# Patient Record
Sex: Female | Born: 1960 | Race: White | Hispanic: No | Smoking: Never smoker
Health system: Southern US, Community
[De-identification: ages and names within clinical notes are randomized; demographics above are authoritative.]

## PROBLEM LIST (undated history)

## (undated) DIAGNOSIS — E785 Hyperlipidemia, unspecified: Secondary | ICD-10-CM

## (undated) DIAGNOSIS — R972 Elevated prostate specific antigen [PSA]: Secondary | ICD-10-CM

## (undated) DIAGNOSIS — N400 Enlarged prostate without lower urinary tract symptoms: Secondary | ICD-10-CM

## (undated) DIAGNOSIS — T7840XA Allergy, unspecified, initial encounter: Secondary | ICD-10-CM

## (undated) DIAGNOSIS — R55 Syncope and collapse: Secondary | ICD-10-CM

## (undated) HISTORY — DX: Hyperlipidemia, unspecified: E78.5

## (undated) HISTORY — DX: Elevated prostate specific antigen (PSA): R97.20

## (undated) HISTORY — DX: Allergy, unspecified, initial encounter: T78.40XA

---

## 1983-02-12 DIAGNOSIS — R55 Syncope and collapse: Secondary | ICD-10-CM | POA: Insufficient documentation

## 2003-02-12 DIAGNOSIS — Z8679 Personal history of other diseases of the circulatory system: Secondary | ICD-10-CM | POA: Insufficient documentation

## 2005-02-11 DIAGNOSIS — Z86018 Personal history of other benign neoplasm: Secondary | ICD-10-CM

## 2005-02-11 HISTORY — DX: Personal history of other benign neoplasm: Z86.018

## 2012-02-12 DIAGNOSIS — R972 Elevated prostate specific antigen [PSA]: Secondary | ICD-10-CM

## 2012-02-12 HISTORY — DX: Elevated prostate specific antigen (PSA): R97.20

## 2012-04-09 ENCOUNTER — Encounter: Payer: Self-pay | Admitting: Gastroenterology

## 2012-04-09 ENCOUNTER — Ambulatory Visit: Payer: Self-pay | Admitting: Gastroenterology

## 2012-05-12 HISTORY — PX: COLONOSCOPY: SHX174

## 2012-06-26 DIAGNOSIS — R972 Elevated prostate specific antigen [PSA]: Secondary | ICD-10-CM | POA: Insufficient documentation

## 2012-08-02 ENCOUNTER — Emergency Department (HOSPITAL_COMMUNITY): Payer: BC Managed Care – PPO

## 2012-08-02 ENCOUNTER — Emergency Department (HOSPITAL_COMMUNITY)
Admission: EM | Admit: 2012-08-02 | Discharge: 2012-08-02 | Disposition: A | Discharge status: Home / Self Care | Payer: BC Managed Care – PPO | Attending: Emergency Medicine | Admitting: Emergency Medicine

## 2012-08-02 ENCOUNTER — Encounter (HOSPITAL_COMMUNITY): Payer: Self-pay | Admitting: Emergency Medicine

## 2012-08-02 DIAGNOSIS — Y998 Other external cause status: Secondary | ICD-10-CM | POA: Insufficient documentation

## 2012-08-02 DIAGNOSIS — Z79899 Other long term (current) drug therapy: Secondary | ICD-10-CM | POA: Insufficient documentation

## 2012-08-02 DIAGNOSIS — N400 Enlarged prostate without lower urinary tract symptoms: Secondary | ICD-10-CM | POA: Insufficient documentation

## 2012-08-02 DIAGNOSIS — S43101A Unspecified dislocation of right acromioclavicular joint, initial encounter: Secondary | ICD-10-CM

## 2012-08-02 DIAGNOSIS — S43109A Unspecified dislocation of unspecified acromioclavicular joint, initial encounter: Secondary | ICD-10-CM | POA: Insufficient documentation

## 2012-08-02 HISTORY — DX: Benign prostatic hyperplasia without lower urinary tract symptoms: N40.0

## 2012-08-02 HISTORY — DX: Syncope and collapse: R55

## 2012-08-02 MED ORDER — OXYCODONE-ACETAMINOPHEN 5-325 MG PO TABS: ORAL_TABLET | ORAL | Status: DC
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
## 2012-08-04 NOTE — ED Provider Notes (Signed)
Medical screening examination/treatment/procedure(s) were performed by non-physician practitioner and as supervising physician I was immediately available for consultation/collaboration.   Shelda Jakes, MD 08/04/12 [phone]

## 2013-06-21 DIAGNOSIS — M545 Low back pain, unspecified: Secondary | ICD-10-CM | POA: Insufficient documentation

## 2015-05-16 DIAGNOSIS — D239 Other benign neoplasm of skin, unspecified: Secondary | ICD-10-CM

## 2015-05-16 HISTORY — DX: Other benign neoplasm of skin, unspecified: D23.9

## 2016-03-19 ENCOUNTER — Encounter (HOSPITAL_COMMUNITY): Payer: Self-pay | Admitting: Emergency Medicine

## 2016-03-19 ENCOUNTER — Ambulatory Visit (HOSPITAL_COMMUNITY)
Admission: EM | Admit: 2016-03-19 | Discharge: 2016-03-19 | Disposition: A | Discharge status: Home / Self Care | Payer: BLUE CROSS/BLUE SHIELD | Attending: Family Medicine | Admitting: Family Medicine

## 2016-03-19 DIAGNOSIS — J40 Bronchitis, not specified as acute or chronic: Secondary | ICD-10-CM

## 2016-03-19 MED ORDER — HYDROCODONE-HOMATROPINE 5-1.5 MG/5ML PO SYRP: 5.0000 mL | ORAL_SOLUTION | Freq: Four times a day (QID) | ORAL | 0 refills | Status: DC | PRN

## 2016-03-19 MED ORDER — ALBUTEROL SULFATE HFA 108 (90 BASE) MCG/ACT IN AERS: 2.0000 | INHALATION_SPRAY | RESPIRATORY_TRACT | 1 refills | Status: DC | PRN

## 2016-03-19 NOTE — ED Triage Notes (Signed)
The patient presented to the [facility] with a complaint of a cough, congestion and body aches x 2 weeks. The patient reported already being evaluated and treated with an antibiotic.

## 2016-03-19 NOTE — ED Provider Notes (Signed)
[place]    CSN: [id number] Arrival date & time: 03/19/16  1701     History   Chief Complaint Chief Complaint  Patient presents with  . Cough    HPI Stacey Oconnell is a 56 y.o. female.   Stacey Oconnell is a 56 year old man who has a cough for 2 weeks. He's only taken a round of antibiotics without improvement. Some days are better than others and the wheezing has resolved but he still has a violent cough.  Patient is working on Radio producer.      Past Medical History:  Diagnosis Date  . Enlarged prostate   . Syncope     There are no active problems to display for this patient.   History reviewed. No pertinent surgical history.     Home Medications    Prior to Admission medications   Medication Sig Start Date End Date Taking? Authorizing Provider  azithromycin (ZITHROMAX) 1 g powder Take 1 g by mouth once.   Yes Historical Provider, MD  methylPREDNISolone acetate (DEPO-MEDROL) 40 MG/ML injection (RADIOLOGY ONLY) 40 mg once.   Yes Historical Provider, MD  albuterol (PROVENTIL HFA;VENTOLIN HFA) 108 (90 Base) MCG/ACT inhaler Inhale 2 puffs into the lungs every 4 (four) hours as needed for wheezing or shortness of breath (cough, shortness of breath or wheezing.). 03/19/16   Robyn Haber, MD  HYDROcodone-homatropine (HYDROMET) 5-1.5 MG/5ML syrup Take 5 mLs by mouth every 6 (six) hours as needed for cough. 03/19/16   Robyn Haber, MD    Family History History reviewed. No pertinent family history.  Social History Social History  Substance Use Topics  . Smoking status: Never Smoker  . Smokeless tobacco: Not on file  . Alcohol use Yes     Comment: Social     Allergies   Patient has no known allergies.   Review of Systems Review of Systems  Constitutional: Positive for activity change. Negative for fever.  HENT: Negative.   Respiratory: Positive for cough. Negative for wheezing.   Cardiovascular: Negative.   Gastrointestinal: Negative.     Musculoskeletal: Negative.   Neurological: Negative.      Physical Exam Triage Vital Signs ED Triage Vitals  Enc Vitals Group     BP 03/19/16 1827 106/74     Pulse Rate 03/19/16 1827 102     Resp 03/19/16 1827 16     Temp 03/19/16 1827 98.7 F (37.1 C)     Temp Source 03/19/16 1827 Oral     SpO2 03/19/16 1827 100 %     Weight --      Height --      Head Circumference --      Peak Flow --      Pain Score 03/19/16 1826 0     Pain Loc --      Pain Edu? --      Excl. in [place]? --    No data found.   Updated Vital Signs BP 106/74 (BP Location: Right Arm)   Pulse 102   Temp 98.7 F (37.1 C) (Oral)   Resp 16   SpO2 100%    Physical Exam  Constitutional: He is oriented to person, place, and time. He appears well-developed and well-nourished.  HENT:  Right Ear: External ear normal.  Left Ear: External ear normal.  Mouth/Throat: Oropharynx is clear and moist.  Eyes: Conjunctivae are normal. Pupils are equal, round, and reactive to light.  Neck: Normal range of motion. Neck supple.  Cardiovascular: Normal rate and  regular rhythm.   Pulmonary/Chest: Effort normal. He has wheezes. He has rales.  Musculoskeletal: Normal range of motion.  Neurological: He is alert and oriented to person, place, and time.  Skin: Skin is warm and dry.  Nursing note and vitals reviewed.    UC Treatments / Results  Labs (all labs ordered are listed, but only abnormal results are displayed) Labs Reviewed - No data to display  EKG  EKG Interpretation None       Radiology No results found.  Procedures Procedures (including critical care time)  Medications Ordered in UC Medications - No data to display   Initial Impression / Assessment and Plan / UC Course  I have reviewed the triage vital signs and the nursing notes.  Pertinent labs & imaging results that were available during my care of the patient were reviewed by me and considered in my medical decision making (see chart for  details).     Final Clinical Impressions(s) / UC Diagnoses   Final diagnoses:  Bronchitis    New Prescriptions New Prescriptions   ALBUTEROL (PROVENTIL HFA;VENTOLIN HFA) 108 (90 BASE) MCG/ACT INHALER    Inhale 2 puffs into the lungs every 4 (four) hours as needed for wheezing or shortness of breath (cough, shortness of breath or wheezing.).   HYDROCODONE-HOMATROPINE (HYDROMET) 5-1.5 MG/5ML SYRUP    Take 5 mLs by mouth every 6 (six) hours as needed for cough.     Robyn Haber, MD 03/19/16 [phone]

## 2017-07-21 ENCOUNTER — Ambulatory Visit: Payer: BLUE CROSS/BLUE SHIELD | Admitting: Family Medicine

## 2017-07-21 ENCOUNTER — Encounter: Payer: Self-pay | Admitting: Family Medicine

## 2017-07-21 ENCOUNTER — Other Ambulatory Visit: Payer: Self-pay

## 2017-07-21 VITALS — BP 121/78 | HR 90 | Temp 97.8°F | Resp 16 | Ht 73.0 in | Wt 169.8 lb

## 2017-07-21 DIAGNOSIS — R972 Elevated prostate specific antigen [PSA]: Secondary | ICD-10-CM

## 2017-07-21 DIAGNOSIS — Z79899 Other long term (current) drug therapy: Secondary | ICD-10-CM | POA: Diagnosis not present

## 2017-07-21 DIAGNOSIS — Z8679 Personal history of other diseases of the circulatory system: Secondary | ICD-10-CM

## 2017-07-21 DIAGNOSIS — E782 Mixed hyperlipidemia: Secondary | ICD-10-CM | POA: Diagnosis not present

## 2017-07-21 DIAGNOSIS — E559 Vitamin D deficiency, unspecified: Secondary | ICD-10-CM

## 2017-07-21 DIAGNOSIS — Z7952 Long term (current) use of systemic steroids: Secondary | ICD-10-CM

## 2017-07-21 DIAGNOSIS — Z789 Other specified health status: Secondary | ICD-10-CM

## 2017-07-21 DIAGNOSIS — F640 Transsexualism: Secondary | ICD-10-CM

## 2017-07-21 LAB — POCT URINALYSIS DIP (MANUAL ENTRY)
BILIRUBIN UA: NEGATIVE mg/dL
Bilirubin, UA: NEGATIVE
Blood, UA: NEGATIVE
Glucose, UA: NEGATIVE mg/dL
LEUKOCYTES UA: NEGATIVE
Nitrite, UA: NEGATIVE
PH UA: 6 (ref 5.0–8.0)
PROTEIN UA: NEGATIVE mg/dL
Spec Grav, UA: 1.02 (ref 1.010–1.025)
Urobilinogen, UA: 0.2 E.U./dL

## 2017-07-21 MED ORDER — SPIRONOLACTONE 100 MG PO TABS: 100.0000 mg | ORAL_TABLET | Freq: Two times a day (BID) | ORAL | 1 refills | Status: DC

## 2017-07-21 MED ORDER — ESTRADIOL 2 MG PO TABS: 2.0000 mg | ORAL_TABLET | Freq: Three times a day (TID) | ORAL | 1 refills | Status: DC

## 2017-07-21 NOTE — Patient Instructions (Addendum)
Start 2000u/d over-the-counter vitamin D supplement. Consider supplementing with red yeast rice or coenzyme Q10 which are different products thought to help bring cholesterol down. We are not sure if otc fish oil supplements actually do anything other than make the numbers look prettier anymore but it doesn't hurt to try to add this in to make your numbers look prettier if you would like. I will calculate your cardiac risk score and send you a MyChart email as FYI in my down time.  Delightful meeting you today!  Thanks for transferring your care here and I will really look forward to getting to know you better over the years.    IF you received an x-ray today, you will receive an invoice from [facility]. Please contact [facility] at [phone] with questions or concerns regarding your invoice.   IF you received labwork today, you will receive an invoice from [place]. Please contact [facility] at [phone] with questions or concerns regarding your invoice.   Our billing staff will not be able to assist you with questions regarding bills from these companies.  You will be contacted with the lab results as soon as they are available. The fastest way to get your results is to activate your My Chart account. Instructions are located on the last page of this paperwork. If you have not heard from [place] regarding the results in 2 weeks, please contact this office.      Mediterranean Diet A Mediterranean diet refers to food and lifestyle choices that are based on the traditions of countries located on the [employer]. This way of eating has been shown to help prevent certain conditions and improve outcomes for people who have chronic diseases, like kidney disease and heart disease. What are tips for following this plan? Lifestyle  Cook and eat meals together with your family, when possible.  Drink enough fluid to keep your urine clear or pale yellow.  Be physically  active every day. This includes: ? Aerobic exercise like running or swimming. ? Leisure activities like gardening, walking, or housework.  Get 7-8 hours of sleep each night.  If recommended by your health care provider, drink red wine in moderation. This means 1 glass a day for nonpregnant women and 2 glasses a day for men. A glass of wine equals 5 oz (150 mL). Reading food labels  Check the serving size of packaged foods. For foods such as rice and pasta, the serving size refers to the amount of cooked product, not dry.  Check the total fat in packaged foods. Avoid foods that have saturated fat or trans fats.  Check the ingredients list for added sugars, such as corn syrup. Shopping  At the grocery store, buy most of your food from the areas near the walls of the store. This includes: ? Fresh fruits and vegetables (produce). ? Grains, beans, nuts, and seeds. Some of these may be available in unpackaged forms or large amounts (in bulk). ? Fresh seafood. ? Poultry and eggs. ? Low-fat dairy products.  Buy whole ingredients instead of prepackaged foods.  Buy fresh fruits and vegetables in-season from local farmers markets.  Buy frozen fruits and vegetables in resealable bags.  If you do not have access to quality fresh seafood, buy precooked frozen shrimp or canned fish, such as tuna, salmon, or sardines.  Buy small amounts of raw or cooked vegetables, salads, or olives from the deli or salad bar at your store.  Stock your pantry so you always have certain foods  on hand, such as olive oil, canned tuna, canned tomatoes, rice, pasta, and beans. Cooking  Cook foods with extra-virgin olive oil instead of using butter or other vegetable oils.  Have meat as a side dish, and have vegetables or grains as your main dish. This means having meat in small portions or adding small amounts of meat to foods like pasta or stew.  Use beans or vegetables instead of meat in common dishes like chili  or lasagna.  Experiment with different cooking methods. Try roasting or broiling vegetables instead of steaming or sauteing them.  Add frozen vegetables to soups, stews, pasta, or rice.  Add nuts or seeds for added healthy fat at each meal. You can add these to yogurt, salads, or vegetable dishes.  Marinate fish or vegetables using olive oil, lemon juice, garlic, and fresh herbs. Meal planning  Plan to eat 1 vegetarian meal one day each week. Try to work up to 2 vegetarian meals, if possible.  Eat seafood 2 or more times a week.  Have healthy snacks readily available, such as: ? Vegetable sticks with hummus. ? [place] yogurt. ? Fruit and nut trail mix.  Eat balanced meals throughout the week. This includes: ? Fruit: 2-3 servings a day ? Vegetables: 4-5 servings a day ? Low-fat dairy: 2 servings a day ? Fish, poultry, or lean meat: 1 serving a day ? Beans and legumes: 2 or more servings a week ? Nuts and seeds: 1-2 servings a day ? Whole grains: 6-8 servings a day ? Extra-virgin olive oil: 3-4 servings a day  Limit red meat and sweets to only a few servings a month What are my food choices?  Mediterranean diet ? Recommended ? Grains: Whole-grain pasta. Brown rice. Bulgar wheat. Polenta. Couscous. Whole-wheat bread. [place]. ? Vegetables: Artichokes. Beets. Broccoli. Cabbage. Carrots. Eggplant. Green beans. Chard. Kale. Spinach. Onions. Leeks. Peas. Squash. Tomatoes. Peppers. Radishes. ? Fruits: Apples. Apricots. Avocado. Berries. Bananas. Cherries. Dates. Figs. Grapes. Lemons. Melon. Oranges. Peaches. Plums. Pomegranate. ? Meats and other protein foods: Beans. Almonds. Sunflower seeds. Pine nuts. Peanuts. [place]. Salmon. Scallops. Shrimp. [place]. Tilapia. Clams. Oysters. Eggs. ? Dairy: Low-fat milk. Cheese. Greek yogurt. ? Beverages: Water. Red wine. Herbal tea. ? Fats and oils: Extra virgin olive oil. Avocado oil. Grape seed oil. ? Sweets and desserts: [place] yogurt with  honey. Baked apples. Poached pears. Trail mix. ? Seasoning and other foods: Basil. Cilantro. Coriander. Cumin. Mint. Parsley. Sage. Rosemary. Tarragon. Garlic. Oregano. Thyme. Pepper. Balsalmic vinegar. Tahini. Hummus. Tomato sauce. Olives. Mushrooms. ? Limit these ? Grains: Prepackaged pasta or rice dishes. Prepackaged cereal with added sugar. ? Vegetables: Deep fried potatoes (french fries). ? Fruits: Fruit canned in syrup. ? Meats and other protein foods: Beef. Pork. Lamb. Poultry with skin. Hot dogs. Berniece Salines. ? Dairy: Ice cream. Sour cream. Whole milk. ? Beverages: Juice. Sugar-sweetened soft drinks. Beer. Liquor and spirits. ? Fats and oils: Butter. Canola oil. Vegetable oil. Beef fat (tallow). Lard. ? Sweets and desserts: Cookies. Cakes. Pies. Candy. ? Seasoning and other foods: Mayonnaise. Premade sauces and marinades. ? The items listed may not be a complete list. Talk with your dietitian about what dietary choices are right for you. Summary  The Mediterranean diet includes both food and lifestyle choices.  Eat a variety of fresh fruits and vegetables, beans, nuts, seeds, and whole grains.  Limit the amount of red meat and sweets that you eat.  Talk with your health care provider about whether it is safe for you to  drink red wine in moderation. This means 1 glass a day for nonpregnant women and 2 glasses a day for men. A glass of wine equals 5 oz (150 mL). This information is not intended to replace advice given to you by your health care provider. Make sure you discuss any questions you have with your health care provider. Document Released: 09/21/2015 Document Revised: 10/24/2015 Document Reviewed: 09/21/2015 Elsevier Interactive Patient Education  [employer].    Why follow it? Research shows. . Those who follow the Mediterranean diet have a reduced risk of heart disease  . The diet is associated with a reduced incidence of Parkinson's and Alzheimer's diseases . People  following the diet may have longer life expectancies and lower rates of chronic diseases  . The Dietary Guidelines for Americans recommends the Mediterranean diet as an eating plan to promote health and prevent disease  What Is the Mediterranean Diet?  . Healthy eating plan based on typical foods and recipes of Mediterranean-style cooking . The diet is primarily a plant based diet; these foods should make up a majority of meals   Starches - Plant based foods should make up a majority of meals - They are an important sources of vitamins, minerals, energy, antioxidants, and fiber - Choose whole grains, foods high in fiber and minimally processed items  - Typical grain sources include wheat, oats, barley, corn, brown rice, bulgar, farro, millet, polenta, couscous  - Various types of beans include chickpeas, lentils, fava beans, black beans, white beans   Fruits  Veggies - Large quantities of antioxidant rich fruits & veggies; 6 or more servings  - Vegetables can be eaten raw or lightly drizzled with oil and cooked  - Vegetables common to the traditional Mediterranean Diet include: artichokes, arugula, beets, broccoli, brussel sprouts, cabbage, carrots, celery, collard greens, cucumbers, eggplant, kale, leeks, lemons, lettuce, mushrooms, okra, onions, peas, peppers, potatoes, pumpkin, radishes, rutabaga, shallots, spinach, sweet potatoes, turnips, zucchini - Fruits common to the Mediterranean Diet include: apples, apricots, avocados, cherries, clementines, dates, figs, grapefruits, grapes, melons, nectarines, oranges, peaches, pears, pomegranates, strawberries, tangerines  Fats - Replace butter and margarine with healthy oils, such as olive oil, canola oil, and tahini  - Limit nuts to no more than a handful a day  - Nuts include walnuts, almonds, pecans, pistachios, pine nuts  - Limit or avoid candied, honey roasted or heavily salted nuts - Olives are central to the [employer] - can be eaten  whole or used in a variety of dishes   Meats Protein - Limiting red meat: no more than a few times a month - When eating red meat: choose lean cuts and keep the portion to the size of deck of cards - Eggs: approx. 0 to 4 times a week  - Fish and lean poultry: at least 2 a week  - Healthy protein sources include, chicken, [place], lean beef, lamb - Increase intake of seafood such as tuna, salmon, trout, mackerel, shrimp, scallops - Avoid or limit high fat processed meats such as sausage and bacon  Dairy - Include moderate amounts of low fat dairy products  - Focus on healthy dairy such as fat free yogurt, skim milk, low or reduced fat cheese - Limit dairy products higher in fat such as whole or 2% milk, cheese, ice cream  Alcohol - Moderate amounts of red wine is ok  - No more than 5 oz daily for women (all ages) and men older than age 62  - No  more than 10 oz of wine daily for men younger than 48  Other - Limit sweets and other desserts  - Use herbs and spices instead of salt to flavor foods  - Herbs and spices common to the traditional Mediterranean Diet include: basil, bay leaves, chives, cloves, cumin, fennel, garlic, lavender, marjoram, mint, oregano, parsley, pepper, rosemary, sage, savory, sumac, tarragon, thyme   It's not just a diet, it's a lifestyle:  . The Mediterranean diet includes lifestyle factors typical of those in the region  . Foods, drinks and meals are best eaten with others and savored . Daily physical activity is important for overall good health . This could be strenuous exercise like running and aerobics . This could also be more leisurely activities such as walking, housework, yard-work, or taking the stairs . Moderation is the key; a balanced and healthy diet accommodates most foods and drinks . Consider portion sizes and frequency of consumption of certain foods   Meal Ideas & Options:  . Breakfast:  o Whole wheat toast or whole wheat English muffins with peanut  butter & hard boiled egg o Steel cut oats topped with apples & cinnamon and skim milk  o Fresh fruit: banana, strawberries, melon, berries, peaches  o Smoothies: strawberries, bananas, greek yogurt, peanut butter o Low fat greek yogurt with blueberries and granola  o Egg white omelet with spinach and mushrooms o Breakfast couscous: whole wheat couscous, apricots, skim milk, cranberries  . Sandwiches:  o Hummus and grilled vegetables (peppers, zucchini, squash) on whole wheat bread   o Grilled chicken on whole wheat pita with lettuce, tomatoes, cucumbers or tzatziki  o Tuna salad on whole wheat bread: tuna salad made with greek yogurt, olives, red peppers, capers, green onions o Garlic rosemary lamb pita: lamb sauted with garlic, rosemary, salt & pepper; add lettuce, cucumber, greek yogurt to pita - flavor with lemon juice and black pepper  . Seafood:  o Mediterranean grilled salmon, seasoned with garlic, basil, parsley, lemon juice and black pepper o Shrimp, lemon, and spinach whole-grain pasta salad made with low fat greek yogurt  o Seared scallops with lemon orzo  o Seared tuna steaks seasoned salt, pepper, coriander topped with tomato mixture of olives, tomatoes, olive oil, minced garlic, parsley, green onions and cappers  . Meats:  o Herbed greek chicken salad with kalamata olives, cucumber, feta  o Red bell peppers stuffed with spinach, bulgur, lean ground beef (or lentils) & topped with feta   o Kebabs: skewers of chicken, tomatoes, onions, zucchini, squash  o [place] burgers: made with red onions, mint, dill, lemon juice, feta cheese topped with roasted red peppers . Vegetarian o Cucumber salad: cucumbers, artichoke hearts, celery, red onion, feta cheese, tossed in olive oil & lemon juice  o Hummus and whole grain pita points with a greek salad (lettuce, tomato, feta, olives, cucumbers, red onion) o Lentil soup with celery, carrots made with vegetable broth, garlic, salt and pepper   o Tabouli salad: parsley, bulgur, mint, scallions, cucumbers, tomato, radishes, lemon juice, olive oil, salt and pepper.

## 2017-07-21 NOTE — Progress Notes (Addendum)
Subjective:  By signing my name below, I, Stacey Oconnell, attest that this documentation has been prepared under the direction and in the presence of Stacey Cheadle, MD Electronically Signed: Ladene Artist, ED Scribe 07/21/2017 at 10:34 AM.   Patient ID: Stacey Oconnell, adult    DOB: 1960-08-21, 57 y.o.   MRN: [id number]  Chief Complaint  Patient presents with  . Establish Care   HPI  Stacey Oconnell is a 56 y.o. adult who presents to Primary Care at [facility] to establish care. Prev followed by Dr. Vella Raring for 2-3 yrs. Taking estradiol 1 tab tid and spironolactone 100 mg bid. Pt states that she initially started the transition hoping she would hold onto family/friends; states she never lost anyone but actually gained a lot of friends. She was able to maintain her job, has not experienced any negatives and would not change a thing.  Plans to have surgery within the next few yrs; has had previous psychological clearance for medical hormone therapy and surgery by Stacey Oconnell at [facility].  Pt has had normal/neg HIV and Hep C screening.  H/o BPH with Elevated PSA Pt does report 1 elevated PSA in 2014. Oncologist at [facility] suspected irritation from riding her bike a lot at that time. Reports normal PSA readings since.  Allergies Pt reports flare up of allergies in May. Took Zyrtec x 2 wks, never needed to take Flonase. Has taken allergy shots as a child.  H/o High Cholesterol Pt does not eat any fast foods. Eats very little meat, generally has a lot of salads, fish and nuts, occasional vegetarian pasta, cooks with EVOO, rarely eats cakes or cookies. Father has a h/o high cholesterol. Started playing volleyball 2 wks ago, yoga 2 months ago and walking more.  Past Medical History:  Diagnosis Date  . Enlarged prostate   . Syncope    Current Outpatient Medications on File Prior to Visit  Medication Sig Dispense Refill  . estradiol (ESTRACE) 2 MG tablet Take 2 mg by mouth 2  (two) times daily.    Marland Kitchen spironolactone (ALDACTONE) 100 MG tablet Take 100 mg by mouth 2 (two) times daily.     No current facility-administered medications on file prior to visit.    No Known Allergies   History reviewed. No pertinent surgical history. History reviewed. No pertinent family history. Social History   Socioeconomic History  . Marital status: Divorced    Spouse name: Not on file  . Number of children: Not on file  . Years of education: Not on file  . Highest education level: Not on file  Occupational History  . Not on file  Social Needs  . Financial resource strain: Not on file  . Food insecurity:    Worry: Not on file    Inability: Not on file  . Transportation needs:    Medical: Not on file    Non-medical: Not on file  Tobacco Use  . Smoking status: Never Smoker  . Smokeless tobacco: Never Used  Substance and Sexual Activity  . Alcohol use: Yes    Comment: Social  . Drug use: No  . Sexual activity: Not on file  Lifestyle  . Physical activity:    Days per week: Not on file    Minutes per session: Not on file  . Stress: Not on file  Relationships  . Social connections:    Talks on phone: Not on file    Gets together: Not on file  Attends religious service: Not on file    Active member of club or organization: Not on file    Attends meetings of clubs or organizations: Not on file    Relationship status: Not on file  Other Topics Concern  . Not on file  Social History Narrative  . Not on file   Depression screen [facility] 2/9 07/21/2017  Decreased Interest 0  Down, Depressed, Hopeless 0  PHQ - 2 Score 0    Review of Systems  Allergic/Immunologic: Negative for environmental allergies (resolved).      Objective:   Physical Exam  Constitutional: She is oriented to person, place, and time. She appears well-developed and well-nourished. No distress.  HENT:  Head: Normocephalic and atraumatic.  Eyes: Conjunctivae and EOM are normal.  Neck: Neck  supple. No tracheal deviation present.  Cardiovascular: Normal rate, regular rhythm and normal heart sounds.  Pulmonary/Chest: Effort normal and breath sounds normal. No respiratory distress.  Musculoskeletal: Normal range of motion.  Neurological: She is alert and oriented to person, place, and time.  Skin: Skin is warm and dry.  Psychiatric: She has a normal mood and affect. Her behavior is normal.  Nursing note and vitals reviewed.  BP 121/78   Pulse 90   Temp 97.8 F (36.6 C)   Resp 16   Ht 6\' 1"  (1.854 m)   Wt 169 lb 12.8 oz (77 kg)   SpO2 100%   BMI 22.40 kg/m     EKG: NSR, no acute ischemic changes noted. No prior EKG for comparison. I have personally reviewed the EKG tracing and agree with the computer interpretation.  Results for orders placed or performed in visit on 07/21/17  POCT urinalysis dipstick  Result Value Ref Range   Color, UA yellow yellow   Clarity, UA clear clear   Glucose, UA negative negative mg/dL   Bilirubin, UA negative negative   Ketones, POC UA negative negative mg/dL   Spec Grav, UA 1.020 1.010 - 1.025   Blood, UA negative negative   pH, UA 6.0 5.0 - 8.0   Protein Ur, POC negative negative mg/dL   Urobilinogen, UA 0.2 0.2 or 1.0 E.U./dL   Nitrite, UA Negative Negative   Leukocytes, UA Negative Negative   Labs from [facility] 07/04/17 Cr 1.23, LDL 146, HDL 57, TC 256, glucose 92, PSA 0.2 TSH nml Vit D 20.5  Assessment & Plan:  Just had CPE w/ PCP Dr. Ola Spurr at [facility] <2 wks pror 07/09/17 - results avail for review in Epic. 1. Mixed hyperlipidemia - last labs Trig 265 HDL 57 LDL 146 Tot 4.5  2. Vitamin D deficiency - Vit D 20.5 - now supplementing  3. History of orthostatic hypotension   4. Elevated prostate specific antigen (PSA)  - 07/04/17 PSA 0.2  5. Encounter for long-term (current) use of high-risk medication   6. Long term (current) use of systemic steroids   7. Female-to-female transgender person    RTC for CPE  with fasting labs x 6 mos  Orders Placed This Encounter  Procedures  . POCT urinalysis dipstick  . EKG 12-Lead    Meds ordered this encounter  Medications  . estradiol (ESTRACE) 2 MG tablet    Sig: Take 1 tablet (2 mg total) by mouth 3 (three) times daily.    Dispense:  270 tablet    Refill:  1  . spironolactone (ALDACTONE) 100 MG tablet    Sig: Take 1 tablet (100 mg total) by mouth 2 (two) times  daily.    Dispense:  180 tablet    Refill:  1    I personally performed the services described in this documentation, which was scribed in my presence. The recorded information has been reviewed and considered, and addended by me as needed.   Stacey Oconnell, M.D.  Primary Care at [facility] [address] [place], [place] [address] [phone] phone [phone] fax  10/01/17 9:25 PM

## 2017-09-22 ENCOUNTER — Telehealth: Payer: Self-pay | Admitting: Family Medicine

## 2017-09-22 NOTE — Telephone Encounter (Signed)
Called pt. To reschedule appt. On 01/19/18. rescheduled

## 2017-09-28 ENCOUNTER — Encounter: Payer: Self-pay | Admitting: Family Medicine

## 2017-09-29 ENCOUNTER — Telehealth: Payer: Self-pay | Admitting: Family Medicine

## 2017-09-29 NOTE — Telephone Encounter (Signed)
Copied from [place] [phone]. Topic: Quick Communication - See Telephone Encounter >> Sep 29, 2017 12:38 PM Neva Seat wrote: Pt is having abdominal pain - hurts on and off for 2 weeks.  No other issues.  Pt wanting to be seen this week if possible.

## 2017-09-30 NOTE — Telephone Encounter (Signed)
Please call pt and see if you can get her in this week or next. Thank you

## 2017-10-01 ENCOUNTER — Encounter: Payer: Self-pay | Admitting: Family Medicine

## 2017-10-01 NOTE — Progress Notes (Signed)
Subjective:    Patient ID: Stacey Oconnell, adult    DOB: Sep 14, 1960, 57 y.o.   MRN: [id number] Chief Complaint  Patient presents with  . Abdominal Pain    luq x less than 2 weeks, ok last 3 days, pain moves, pain is intense and goes away and back again.  Watching diet and eating healthier options, drinking lots of water and exercising.  Per pt it is a little better today.    HPI Has been having LUQ abd pain that is improving but she is concerned about diverticulitis. Has had prior colonoscopy -last 2 CPEs by prior PCP state next colonoscopy due in 10 yrs 04/09/2022 but pt thinks it might be due now - repeated in 5 years (older CPEs by prior PCP also state colonoscopy due 2019 but no further details noted).  Pt concerned that current pain could be due to diverticulitis.   When first started, bowels were fine - last weekend a little mushy. For the past few days has been drinking tons of water Has been walking and riding bike. Not always in the same place - when wakes up will move from lateral flank region to left to just lateral to umbilicus. Has had similar episodes but very fewer episodes  Was more bloated and gassy over the weekend but no longer. No otc meds other than some aleve to see if it would help any potential muscular pain - take 1 in the morning. No Gerd/gastritis/n/v. Walking and laying flat that helps the most. When turns on side hurts more as feels like something was pressing on it.  No fatigue, appetite normal.   She received a letter from Dr. Alice Reichert who did it last time.   Past Medical History:  Diagnosis Date  . Allergy    seasonal  . Elevated prostate specific antigen (PSA) 2014   Pt report oncologist at [facility] suspected irritation from avid bike riding, nml since  . Enlarged prostate   . Hyperlipidemia    managed with Mediterranean diet and exercise, +FHx in father  . Syncope    No past surgical history on file. Current Outpatient Medications on File Prior to  Visit  Medication Sig Dispense Refill  . estradiol (ESTRACE) 2 MG tablet Take 1 tablet (2 mg total) by mouth 3 (three) times daily. 270 tablet 1  . spironolactone (ALDACTONE) 100 MG tablet Take 1 tablet (100 mg total) by mouth 2 (two) times daily. 180 tablet 1   No current facility-administered medications on file prior to visit.    No Known Allergies Family History  Problem Relation Age of Onset  . Hyperlipidemia Father   . Heart disease Father   . Hypertension Father   . Diabetes Mother    Social History   Socioeconomic History  . Marital status: Divorced    Spouse name: Not on file  . Number of children: 0  . Years of education: Not on file  . Highest education level: Not on file  Occupational History  . Not on file  Social Needs  . Financial resource strain: Not on file  . Food insecurity:    Worry: Not on file    Inability: Not on file  . Transportation needs:    Medical: Not on file    Non-medical: Not on file  Tobacco Use  . Smoking status: Never Smoker  . Smokeless tobacco: Never Used  Substance and Sexual Activity  . Alcohol use: Yes    Comment: Social  . Drug use:  No  . Sexual activity: Not on file  Lifestyle  . Physical activity:    Days per week: Not on file    Minutes per session: Not on file  . Stress: Not on file  Relationships  . Social connections:    Talks on phone: Not on file    Gets together: Not on file    Attends religious service: Not on file    Active member of club or organization: Not on file    Attends meetings of clubs or organizations: Not on file    Relationship status: Not on file  Other Topics Concern  . Not on file  Social History Narrative  . Not on file   Depression screen [facility] 2/9 10/02/2017 07/21/2017  Decreased Interest 0 0  Down, Depressed, Hopeless 0 0  PHQ - 2 Score 0 0     Review of Systems See hpi    Objective:   Physical Exam  Constitutional: She appears well-developed and well-nourished. No distress.    HENT:  Head: Normocephalic and atraumatic.  Neck: Normal range of motion. Neck supple. No thyromegaly present.  Cardiovascular: Normal rate, regular rhythm and normal heart sounds.  Pulmonary/Chest: Effort normal and breath sounds normal.  Abdominal: Soft. Normal appearance and bowel sounds are normal. She exhibits no distension and no mass. There is tenderness in the periumbilical area, suprapubic area, left upper quadrant and left lower quadrant. There is no rebound, no guarding and no CVA tenderness. No hernia.  Genitourinary: Rectum normal and prostate normal. Rectal exam shows no tenderness, anal tone normal and guaiac negative stool.  Lymphadenopathy:    She has no cervical adenopathy.  Skin: She is not diaphoretic.    BP 112/72 (BP Location: Right Arm, Patient Position: Sitting, Cuff Size: Normal)   Pulse (!) 111   Temp 99.4 F (37.4 C) (Oral)   Resp 16   Ht 6\' 1"  (1.854 m)   Wt 166 lb 3.2 oz (75.4 kg)   SpO2 99%   BMI 21.93 kg/m     CLINICAL DATA:  Two week history of abdominal pain  EXAM: ABDOMEN - 2 VIEW  COMPARISON:  None.  FINDINGS: Supine and upright images were obtained. There is stool throughout much of the colon. There is no appreciable bowel dilatation or air-fluid level to suggest bowel obstruction. No free air. There is a probable bone island in the inferior right iliac crest. Visualized lung bases are clear. There are phleboliths in the pelvis.  IMPRESSION: Moderate stool throughout colon. No bowel obstruction or free air evident. Visualized lung bases are clear.   Electronically Signed   By: Lowella Grip III M.D.   On: 10/02/2017 14:47  Assessment & Plan:   1. Abdominal pain, left upper quadrant     Orders Placed This Encounter  Procedures  . DG Abd 2 Views    Standing Status:   Future    Number of Occurrences:   1    Standing Expiration Date:   10/02/2018    Order Specific Question:   Reason for Exam (SYMPTOM  OR DIAGNOSIS  REQUIRED)    Answer:   LUQ abdominal pain int x 2 weeks    Order Specific Question:   Is the patient pregnant?    Answer:   No    Order Specific Question:   Preferred imaging location?    Answer:   External  . Comprehensive metabolic panel  . POCT urinalysis dipstick  . POCT CBC    Meds ordered  this encounter  Medications  . simethicone (MYLICON) 476 MG chewable tablet    Sig: Chew 1 tablet (125 mg total) by mouth 4 (four) times daily - after meals and at bedtime.    Dispense:  30 tablet    Refill:  0  . pantoprazole (PROTONIX) 40 MG tablet    Sig: Take 1 tablet (40 mg total) by mouth daily.    Dispense:  30 tablet    Refill:  1    Delman Cheadle, MD, MPH [facility] [place], [place]  [address] [phone] Office phone  [phone] Office fax   12/01/17 1:21 AM

## 2017-10-02 ENCOUNTER — Other Ambulatory Visit: Payer: Self-pay

## 2017-10-02 ENCOUNTER — Encounter: Payer: Self-pay | Admitting: Family Medicine

## 2017-10-02 ENCOUNTER — Ambulatory Visit (INDEPENDENT_AMBULATORY_CARE_PROVIDER_SITE_OTHER): Payer: BLUE CROSS/BLUE SHIELD

## 2017-10-02 ENCOUNTER — Ambulatory Visit: Payer: BLUE CROSS/BLUE SHIELD | Admitting: Family Medicine

## 2017-10-02 VITALS — BP 112/72 | HR 111 | Temp 99.4°F | Resp 16 | Ht 73.0 in | Wt 166.2 lb

## 2017-10-02 DIAGNOSIS — R1012 Left upper quadrant pain: Secondary | ICD-10-CM | POA: Diagnosis not present

## 2017-10-02 LAB — POCT CBC
GRANULOCYTE PERCENT: 4 % — AB (ref 37–80)
HEMATOCRIT: 40.1 % (ref 37.7–47.9)
HEMOGLOBIN: 12.7 g/dL (ref 12.2–16.2)
Lymph, poc: 18.7 — AB (ref 0.6–3.4)
MCH: 29.6 pg (ref 27–31.2)
MCHC: 31.7 g/dL — AB (ref 31.8–35.4)
MCV: 93.3 fL (ref 80–97)
MID (cbc): 72.9 — AB (ref 0–0.9)
MPV: 7.9 fL (ref 0–99.8)
POC Granulocyte: 0.5 — AB (ref 2–6.9)
POC LYMPH PERCENT: 8.4 %L — AB (ref 10–50)
POC MID %: 1 % (ref 0–12)
Platelet Count, POC: 255 10*3/uL (ref 142–424)
RBC: 4.3 M/uL (ref 4.04–5.48)
RDW, POC: 12.5 %
WBC: 5.5 10*3/uL (ref 4.6–10.2)

## 2017-10-02 LAB — POCT URINALYSIS DIP (MANUAL ENTRY)
Bilirubin, UA: NEGATIVE
Blood, UA: NEGATIVE
GLUCOSE UA: NEGATIVE mg/dL
Ketones, POC UA: NEGATIVE mg/dL
LEUKOCYTES UA: NEGATIVE
NITRITE UA: NEGATIVE
Protein Ur, POC: NEGATIVE mg/dL
SPEC GRAV UA: 1.02 (ref 1.010–1.025)
UROBILINOGEN UA: 1 U/dL
pH, UA: 7.5 (ref 5.0–8.0)

## 2017-10-02 MED ORDER — PANTOPRAZOLE SODIUM 40 MG PO TBEC: 40.0000 mg | DELAYED_RELEASE_TABLET | Freq: Every day | ORAL | 1 refills | Status: DC

## 2017-10-02 MED ORDER — SIMETHICONE 125 MG PO CHEW: 125.0000 mg | CHEWABLE_TABLET | Freq: Three times a day (TID) | ORAL | 0 refills | Status: DC

## 2017-10-02 NOTE — Patient Instructions (Addendum)
If you have lab work done today you will be contacted with your lab results within the next 2 weeks.  If you have not heard from [place] then please contact us. The fastest way to get your results is to register for My Chart.   IF you received an x-ray today, you will receive an invoice from [facility]. Please contact [facility] at [phone] with questions or concerns regarding your invoice.   IF you received labwork today, you will receive an invoice from [facility]. Please contact [facility] at [phone] with questions or concerns regarding your invoice.   Our billing staff will not be able to assist you with questions regarding bills from these companies.  You will be contacted with the lab results as soon as they are available. The fastest way to get your results is to activate your My Chart account. Instructions are located on the last page of this paperwork. If you have not heard from [place] regarding the results in 2 weeks, please contact this office.    Low-Fiber Diet Fiber is found in fruits, vegetables, and whole grains. A low-fiber diet restricts fibrous foods that are not digested in the small intestine. A diet containing about 10-15 grams of fiber per day is considered low fiber. Low-fiber diets may be used to:  Promote healing and rest the bowel during intestinal flare-ups.  Prevent blockage of a partially obstructed or narrowed gastrointestinal tract.  Reduce fecal weight and volume.  Slow the movement of feces.  You may be on a low-fiber diet as a transitional diet following surgery, after an injury (trauma), or because of a short (acute) or lifelong (chronic) illness. Your health care provider will determine the length of time you need to stay on this diet. What do I need to know about a low-fiber diet? Always check the fiber content on the packaging's Nutrition Facts label, especially on foods from the grains list. Ask your dietitian if you have  questions about specific foods that are related to your condition, especially if the food is not listed below. In general, a low-fiber food will have less than 2 g of fiber. What foods can I eat? Grains All breads and crackers made with white flour. Sweet rolls, doughnuts, waffles, pancakes, [place] toast, bagels. Pretzels, Melba toast, zwieback. Well-cooked cereals, such as cornmeal, farina, or cream cereals. Dry cereals that do not contain whole grains, fruit, or nuts, such as refined corn, wheat, rice, and oat cereals. Potatoes prepared any way without skins, plain pastas and noodles, refined white rice. Use white flour for baking and making sauces. Use allowed list of grains for casseroles, dumplings, and puddings. Vegetables Strained tomato and vegetable juices. Fresh lettuce, cucumber, spinach. Well-cooked (no skin or pulp) or canned vegetables, such as asparagus, bean sprouts, beets, carrots, green beans, mushrooms, potatoes, pumpkin, spinach, yellow squash, tomato sauce/puree, turnips, yams, and zucchini. Keep servings limited to  cup. Fruits All fruit juices except prune juice. Cooked or canned fruits without skin and seeds, such as applesauce, apricots, cherries, fruit cocktail, grapefruit, grapes, mandarin oranges, melons, peaches, pears, pineapple, and plums. Fresh fruits without skin, such as apricots, avocados, bananas, melons, pineapple, nectarines, and peaches. Keep servings limited to  cup or 1 piece. Meat and Other Protein Sources Ground or well-cooked tender beef, ham, veal, lamb, pork, or poultry. Eggs, plain cheese. Fish, oysters, shrimp, lobster, and other seafood. Liver, organ meats. Smooth nut butters. Dairy All milk products and alternative dairy substitutes, such as soy, rice,  almond, and coconut, not containing added whole nuts, seeds, or added fruit. Beverages Decaf coffee, fruit, and vegetable juices or smoothies (small amounts, with no pulp or skins, and with fruits from  allowed list), sports drinks, herbal tea. Condiments Ketchup, mustard, vinegar, cream sauce, cheese sauce, cocoa powder. Spices in moderation, such as allspice, basil, bay leaves, celery powder or leaves, cinnamon, cumin powder, curry powder, ginger, mace, marjoram, onion or garlic powder, oregano, paprika, parsley flakes, ground pepper, rosemary, sage, savory, tarragon, thyme, and turmeric. Sweets and Desserts Plain cakes and cookies, pie made with allowed fruit, pudding, custard, cream pie. Gelatin, fruit, ice, sherbet, frozen ice pops. Ice cream, ice milk without nuts. Plain hard candy, honey, jelly, molasses, syrup, sugar, chocolate syrup, gumdrops, marshmallows. Limit overall sugar intake. Fats and Oil Margarine, butter, cream, mayonnaise, salad oils, plain salad dressings made from allowed foods. Choose healthy fats such as olive oil, canola oil, and omega-3 fatty acids (such as found in salmon or tuna) when possible. Other Bouillon, broth, or cream soups made from allowed foods. Any strained soup. Casseroles or mixed dishes made with allowed foods. The items listed above may not be a complete list of recommended foods or beverages. Contact your dietitian for more options. What foods are not recommended? Grains All whole wheat and whole grain breads and crackers. Multigrains, rye, bran seeds, nuts, or coconut. Cereals containing whole grains, multigrains, bran, coconut, nuts, raisins. Cooked or dry oatmeal, steel-cut oats. Coarse wheat cereals, granola. Cereals advertised as high fiber. Potato skins. Whole grain pasta, wild or brown rice. Popcorn. Coconut flour. Bran, buckwheat, corn bread, multigrains, rye, wheat germ. Vegetables Fresh, cooked or canned vegetables, such as artichokes, asparagus, beet greens, broccoli, Brussels sprouts, cabbage, celery, cauliflower, corn, eggplant, kale, legumes or beans, okra, peas, and tomatoes. Avoid large servings of any vegetables, especially raw  vegetables. Fruits Fresh fruits, such as apples with or without skin, berries, cherries, figs, grapes, grapefruit, guavas, kiwis, mangoes, oranges, papayas, pears, persimmons, pineapple, and pomegranate. Prune juice and juices with pulp, stewed or dried prunes. Dried fruits, dates, raisins. Fruit seeds or skins. Avoid large servings of all fresh fruits. Meats and Other Protein Sources Tough, fibrous meats with gristle. Chunky nut butter. Cheese made with seeds, nuts, or other foods not recommended. Nuts, seeds, legumes (beans, including baked beans), dried peas, beans, lentils. Dairy Yogurt or cheese that contains nuts, seeds, or added fruit. Beverages Fruit juices with high pulp, prune juice. Caffeinated coffee and teas. Condiments Coconut, maple syrup, pickles, olives. Sweets and Desserts Desserts, cookies, or candies that contain nuts or coconut, chunky peanut butter, dried fruits. Jams, preserves with seeds, marmalade. Large amounts of sugar and sweets. Any other dessert made with fruits from the not recommended list. Other Soups made from vegetables that are not recommended or that contain other foods not recommended. The items listed above may not be a complete list of foods and beverages to avoid. Contact your dietitian for more information. This information is not intended to replace advice given to you by your health care provider. Make sure you discuss any questions you have with your health care provider. Document Released: 07/20/2001 Document Revised: 07/06/2015 Document Reviewed: 12/21/2012 Elsevier Interactive Patient Education  2017 [employer].

## 2017-10-03 LAB — COMPREHENSIVE METABOLIC PANEL
A/G RATIO: 1.9 (ref 1.2–2.2)
ALK PHOS: 29 IU/L — AB (ref 39–117)
ALT: 13 IU/L (ref 0–32)
AST: 14 IU/L (ref 0–40)
Albumin: 4.3 g/dL (ref 3.5–5.5)
BILIRUBIN TOTAL: 0.2 mg/dL (ref 0.0–1.2)
BUN/Creatinine Ratio: 10 (ref 9–23)
BUN: 13 mg/dL (ref 6–24)
CHLORIDE: 102 mmol/L (ref 96–106)
CO2: 22 mmol/L (ref 20–29)
Calcium: 9.6 mg/dL (ref 8.7–10.2)
Creatinine, Ser: 1.29 mg/dL — ABNORMAL HIGH (ref 0.57–1.00)
GFR calc non Af Amer: 46 mL/min/{1.73_m2} — ABNORMAL LOW (ref >59)
GFR, EST AFRICAN AMERICAN: 53 mL/min/{1.73_m2} — AB (ref >59)
GLUCOSE: 81 mg/dL (ref 65–99)
Globulin, Total: 2.3 g/dL (ref 1.5–4.5)
POTASSIUM: 5.1 mmol/L (ref 3.5–5.2)
Sodium: 138 mmol/L (ref 134–144)
TOTAL PROTEIN: 6.6 g/dL (ref 6.0–8.5)

## 2018-01-19 ENCOUNTER — Encounter: Payer: BLUE CROSS/BLUE SHIELD | Admitting: Family Medicine

## 2018-01-20 ENCOUNTER — Other Ambulatory Visit: Payer: Self-pay

## 2018-01-20 ENCOUNTER — Ambulatory Visit (INDEPENDENT_AMBULATORY_CARE_PROVIDER_SITE_OTHER): Payer: BLUE CROSS/BLUE SHIELD | Admitting: Family Medicine

## 2018-01-20 ENCOUNTER — Encounter: Payer: Self-pay | Admitting: Family Medicine

## 2018-01-20 VITALS — BP 124/75 | HR 97 | Temp 98.0°F | Resp 16 | Ht 72.84 in | Wt 160.0 lb

## 2018-01-20 DIAGNOSIS — Z0000 Encounter for general adult medical examination without abnormal findings: Secondary | ICD-10-CM

## 2018-01-20 DIAGNOSIS — Z0001 Encounter for general adult medical examination with abnormal findings: Secondary | ICD-10-CM | POA: Diagnosis not present

## 2018-01-20 DIAGNOSIS — E782 Mixed hyperlipidemia: Secondary | ICD-10-CM

## 2018-01-20 DIAGNOSIS — Z1212 Encounter for screening for malignant neoplasm of rectum: Secondary | ICD-10-CM

## 2018-01-20 DIAGNOSIS — Z7952 Long term (current) use of systemic steroids: Secondary | ICD-10-CM

## 2018-01-20 DIAGNOSIS — Z79899 Other long term (current) drug therapy: Secondary | ICD-10-CM

## 2018-01-20 DIAGNOSIS — R1012 Left upper quadrant pain: Secondary | ICD-10-CM | POA: Diagnosis not present

## 2018-01-20 DIAGNOSIS — E559 Vitamin D deficiency, unspecified: Secondary | ICD-10-CM | POA: Diagnosis not present

## 2018-01-20 DIAGNOSIS — Z1211 Encounter for screening for malignant neoplasm of colon: Secondary | ICD-10-CM

## 2018-01-20 DIAGNOSIS — Z1231 Encounter for screening mammogram for malignant neoplasm of breast: Secondary | ICD-10-CM

## 2018-01-20 DIAGNOSIS — Z23 Encounter for immunization: Secondary | ICD-10-CM

## 2018-01-20 LAB — POCT URINALYSIS DIP (MANUAL ENTRY)
BILIRUBIN UA: NEGATIVE
GLUCOSE UA: NEGATIVE mg/dL
Ketones, POC UA: NEGATIVE mg/dL
Leukocytes, UA: NEGATIVE
NITRITE UA: NEGATIVE
Protein Ur, POC: NEGATIVE mg/dL
RBC UA: NEGATIVE
Spec Grav, UA: 1.02 (ref 1.010–1.025)
Urobilinogen, UA: 0.2 E.U./dL
pH, UA: 6 (ref 5.0–8.0)

## 2018-01-20 LAB — POC MICROSCOPIC URINALYSIS (UMFC): Mucus: ABSENT

## 2018-01-20 MED ORDER — SPIRONOLACTONE 100 MG PO TABS: 100.0000 mg | ORAL_TABLET | Freq: Two times a day (BID) | ORAL | 3 refills | Status: AC

## 2018-01-20 MED ORDER — PANTOPRAZOLE SODIUM 40 MG PO TBEC: 40.0000 mg | DELAYED_RELEASE_TABLET | Freq: Every day | ORAL | 1 refills | Status: DC

## 2018-01-20 MED ORDER — ESTRADIOL 2 MG PO TABS: 2.0000 mg | ORAL_TABLET | Freq: Three times a day (TID) | ORAL | 3 refills | Status: AC

## 2018-01-20 MED ORDER — ZOSTER VAC RECOMB ADJUVANTED 50 MCG/0.5ML IM SUSR: 0.5000 mL | Freq: Once | INTRAMUSCULAR | 1 refills | Status: AC

## 2018-01-20 NOTE — Progress Notes (Signed)
Subjective:    Patient ID: Stacey Oconnell; adult   DOB: 11/24/1960; 56 y.o.   MRN: [id number]  Chief Complaint  Patient presents with  . Annual Exam    HPI   Primary Preventative Screenings: Prostate Cancer:  Family Planning: STI screening: Breast Cancer: Colorectal Cancer: Bone Density: Cardiac: EKG 07/21/2017 - NSR Tobacco use/AAA/Lung Cancer/EtOH/Illicit substances:  Weight/Blood sugar/Diet/Exercise: biking and yoga BMI Readings from Last 3 Encounters:  01/20/18 21.21 kg/m  10/02/17 21.93 kg/m  07/21/17 22.40 kg/m   No results found for: HGBA1C OTC/Vit/Supp/Herbal: Dentist/Optho: Immunizations:  Given prior rx for shingrix at last CPE 06/12/17  There is no immunization history on file for this patient.   Chronic Medical Conditions: Stomach doesn' move as much but does feel it a little daily. No pattern - not related to when or what she eats. Is a lot better. Perhaps only exacerbates when she doesn't eat. Normal GI/GU. Does not wake her from sleep. The simethicone didn't help the first week. Then tried the protonix might have helped but went off around first on Nov - not constant but is throbbing.  Follows regularly with derm  Medical History: Past Medical History:  Diagnosis Date  . Allergy    seasonal  . Elevated prostate specific antigen (PSA) 2014   Pt report oncologist at [facility] suspected irritation from avid bike riding, nml since  . Enlarged prostate   . Hyperlipidemia    managed with Mediterranean diet and exercise, +FHx in father  . Syncope    History reviewed. No pertinent surgical history. Current Outpatient Medications on File Prior to Visit  Medication Sig Dispense Refill  . estradiol (ESTRACE) 2 MG tablet Take 1 tablet (2 mg total) by mouth 3 (three) times daily. 270 tablet 1  . spironolactone (ALDACTONE) 100 MG tablet Take 1 tablet (100 mg total) by mouth 2 (two) times daily. 180 tablet 1   No current facility-administered medications on  file prior to visit.    No Known Allergies Family History  Problem Relation Age of Onset  . Hyperlipidemia Father   . Heart disease Father   . Hypertension Father   . Diabetes Mother    Social History   Socioeconomic History  . Marital status: Divorced    Spouse name: Not on file  . Number of children: 0  . Years of education: Not on file  . Highest education level: Not on file  Occupational History  . Not on file  Social Needs  . Financial resource strain: Not on file  . Food insecurity:    Worry: Not on file    Inability: Not on file  . Transportation needs:    Medical: Not on file    Non-medical: Not on file  Tobacco Use  . Smoking status: Never Smoker  . Smokeless tobacco: Never Used  Substance and Sexual Activity  . Alcohol use: Yes    Comment: Social  . Drug use: No  . Sexual activity: Not on file  Lifestyle  . Physical activity:    Days per week: Not on file    Minutes per session: Not on file  . Stress: Not on file  Relationships  . Social connections:    Talks on phone: Not on file    Gets together: Not on file    Attends religious service: Not on file    Active member of club or organization: Not on file    Attends meetings of clubs or organizations: Not on file  Relationship status: Not on file  Other Topics Concern  . Not on file  Social History Narrative  . Not on file   Depression screen [facility] 2/9 10/02/2017 07/21/2017  Decreased Interest 0 0  Down, Depressed, Hopeless 0 0  PHQ - 2 Score 0 0     ROS Otherwise as noted in HPI.  Objective:  BP 124/75   Pulse 97   Temp 98 F (36.7 C) (Oral)   Resp 16   Ht 6' 0.84" (1.85 m)   Wt 160 lb (72.6 kg)   SpO2 97%   BMI 21.21 kg/m   Visual Acuity Screening   Right eye Left eye Both eyes  Without correction:     With correction: 20/20 20/20 20/20    Physical Exam Constitutional:      General: She is not in acute distress.    Appearance: She is well-developed. She is not diaphoretic.    HENT:     Head: Normocephalic and atraumatic.     Right Ear: Tympanic membrane, ear canal and external ear normal.     Left Ear: Tympanic membrane, ear canal and external ear normal.     Nose: Nose normal. No mucosal edema or rhinorrhea.     Mouth/Throat:     Pharynx: Uvula midline. No posterior oropharyngeal erythema.  Eyes:     General: No scleral icterus.       Right eye: No discharge.        Left eye: No discharge.     Conjunctiva/sclera: Conjunctivae normal.     Pupils: Pupils are equal, round, and reactive to light.  Neck:     Musculoskeletal: Normal range of motion and neck supple.     Thyroid: No thyromegaly.  Cardiovascular:     Rate and Rhythm: Normal rate and regular rhythm.     Heart sounds: Normal heart sounds.  Pulmonary:     Effort: Pulmonary effort is normal. No respiratory distress.     Breath sounds: Normal breath sounds.  Chest:     Breasts: Breasts are symmetrical.        Right: Normal.        Left: Normal.  Abdominal:     General: Bowel sounds are normal.     Palpations: Abdomen is soft.     Tenderness: There is no abdominal tenderness.  Lymphadenopathy:     Cervical: No cervical adenopathy.     Upper Body:     Right upper body: No supraclavicular or axillary adenopathy.     Left upper body: No supraclavicular or axillary adenopathy.  Skin:    General: Skin is warm and dry.     Findings: No erythema.  Neurological:     Mental Status: She is alert and oriented to person, place, and time.     Deep Tendon Reflexes: Reflexes are normal and symmetric.  Psychiatric:        Behavior: Behavior normal.          Stacey Oconnell TESTING Office Visit on 01/20/2018  Component Date Value Ref Range Status  . Color, UA 01/20/2018 yellow  yellow Final  . Clarity, UA 01/20/2018 clear  clear Final  . Glucose, UA 01/20/2018 negative  negative mg/dL Final  . Bilirubin, UA 01/20/2018 negative  negative Final  . Ketones, POC UA 01/20/2018 negative  negative mg/dL Final   . Spec Grav, UA 01/20/2018 1.020  1.010 - 1.025 Final  . Blood, UA 01/20/2018 negative  negative Final  . pH, UA 01/20/2018 6.0  5.0 -  8.0 Final  . Protein Ur, POC 01/20/2018 negative  negative mg/dL Final  . Urobilinogen, UA 01/20/2018 0.2  0.2 or 1.0 E.U./dL Final  . Nitrite, UA 01/20/2018 Negative  Negative Final  . Leukocytes, UA 01/20/2018 Negative  Negative Final  . WBC,UR,HPF,POC 01/20/2018 None  None WBC/hpf Final  . RBC,UR,HPF,POC 01/20/2018 None  None RBC/hpf Final  . Bacteria 01/20/2018 None  None, Too numerous to count Final  . Mucus 01/20/2018 Absent  Absent Final  . Epithelial Cells, UR Per Microscopy 01/20/2018 Few* None, Too numerous to count cells/hpf Final     Assessment & Plan:   1. Annual physical exam   2. Abdominal pain, left upper quadrant   3. Mixed hyperlipidemia   4. Vitamin D deficiency   5. Encounter for long-term (current) use of high-risk medication   6. Long term (current) use of systemic steroids   7. Screening for colorectal cancer   8. Encounter for screening mammogram for breast cancer   9. Left upper quadrant pain     Patient will continue on current chronic medications other than changes noted above, so ok to refill when needed.   Reviewed all health maintenance recommendations per USPSTF guidelines.   See after visit summary for patient specific instructions.  Orders Placed This Encounter  Procedures  . MM DIGITAL SCREENING BILATERAL    Ins. bcbs Baseline No hx br ca / no implants / no needs Pt declines 3d Fh w pt     Standing Status:   Future    Number of Occurrences:   1    Standing Expiration Date:   03/24/2019    Order Specific Question:   Reason for Exam (SYMPTOM  OR DIAGNOSIS REQUIRED)    Answer:   screening for breast cancer    Order Specific Question:   Preferred imaging location?    Answer:   [facility]    Order Specific Question:   Is the patient pregnant?    Answer:   No  . US Abdomen Complete    Wt. 160 Ins.  bcbs No needs Fh w pt  Pt aware of prep and 301 site    Standing Status:   Future    Number of Occurrences:   1    Standing Expiration Date:   03/24/2019    Order Specific Question:   Reason for Exam (SYMPTOM  OR DIAGNOSIS REQUIRED)    Answer:   PERSISTENT luq PAIN/LEFT FLANK PAIN X >4 MOS DAILY    Order Specific Question:   Preferred imaging location?    Answer:   [facility]  . US Renal    Wt. 160 Ins. bcbs No needs Fh w pt  Pt aware of prep and 301 site     Standing Status:   Future    Number of Occurrences:   1    Standing Expiration Date:   03/24/2019    Order Specific Question:   Reason for Exam (SYMPTOM  OR DIAGNOSIS REQUIRED)    Answer:   PERSISTANT DAILY luq ABD/FLANK PAIN X >4 MOS    Order Specific Question:   Preferred imaging location?    Answer:   [facility]  . Tdap vaccine greater than or equal to 7yo IM  . Ambulatory referral to Gastroenterology    Referral Priority:   Routine    Referral Type:   Consultation    Referral Reason:   Specialty Services Required    Number of Visits Requested:   1  .  POCT urinalysis dipstick  . POCT Microscopic Urinalysis ([facility])    Meds ordered this encounter  Medications  . Zoster Vaccine Adjuvanted [facility]) injection    Sig: Inject 0.5 mLs into the muscle once for 1 dose. Repeat once in 2 to 6 months    Dispense:  0.5 mL    Refill:  1  . spironolactone (ALDACTONE) 100 MG tablet    Sig: Take 1 tablet (100 mg total) by mouth 2 (two) times daily.    Dispense:  180 tablet    Refill:  3  . pantoprazole (PROTONIX) 40 MG tablet    Sig: Take 1 tablet (40 mg total) by mouth daily.    Dispense:  30 tablet    Refill:  1  . estradiol (ESTRACE) 2 MG tablet    Sig: Take 1 tablet (2 mg total) by mouth 3 (three) times daily.    Dispense:  270 tablet    Refill:  3    Patient verbalized to me that they understand the following: diagnosis, what is being done for them, what to expect and what should be done at  home.  Their questions have been answered. They understand that I am unable to predict every possible medication interaction or adverse outcome and that if any unexpected symptoms arise, they should contact us and their pharmacist, as well as never hesitate to seek urgent/emergent care at [facility] or ER if they think it might be warranted.    Delman Cheadle, MD, MPH Primary Care at [place], [place]  [address] [phone] Office phone  [phone] Office fax   01/20/18 8:12 AM

## 2018-01-20 NOTE — Patient Instructions (Addendum)
  You need to schedule your mammogram.  Please call [facility] at [phone] to schedule - I have sent them an order.    If you have lab work done today you will be contacted with your lab results within the next 2 weeks.  If you have not heard from [place] then please contact us. The fastest way to get your results is to register for My Chart.   IF you received an x-ray today, you will receive an invoice from [facility]. Please contact [facility] at [phone] with questions or concerns regarding your invoice.   IF you received labwork today, you will receive an invoice from [facility]. Please contact [facility] at [phone] with questions or concerns regarding your invoice.   Our billing staff will not be able to assist you with questions regarding bills from these companies.  You will be contacted with the lab results as soon as they are available. The fastest way to get your results is to activate your My Chart account. Instructions are located on the last page of this paperwork. If you have not heard from [place] regarding the results in 2 weeks, please contact this office.      Abdominal Pain, Adult Abdominal pain can be caused by many things. Often, abdominal pain is not serious and it gets better with no treatment or by being treated at home. However, sometimes abdominal pain is serious. Your health care provider will do a medical history and a physical exam to try to determine the cause of your abdominal pain. Follow these instructions at home:  Take over-the-counter and prescription medicines only as told by your health care provider. Do not take a laxative unless told by your health care provider.  Drink enough fluid to keep your urine clear or pale yellow.  Watch your condition for any changes.  Keep all follow-up visits as told by your health care provider. This is important. Contact a health care provider if:  Your abdominal pain  changes or gets worse.  You are not hungry or you lose weight without trying.  You are constipated or have diarrhea for more than 2-3 days.  You have pain when you urinate or have a bowel movement.  Your abdominal pain wakes you up at night.  Your pain gets worse with meals, after eating, or with certain foods.  You are throwing up and cannot keep anything down.  You have a fever. Get help right away if:  Your pain does not go away as soon as your health care provider told you to expect.  You cannot stop throwing up.  Your pain is only in areas of the abdomen, such as the right side or the left lower portion of the abdomen.  You have bloody or black stools, or stools that look like tar.  You have severe pain, cramping, or bloating in your abdomen.  You have signs of dehydration, such as: ? Dark urine, very little urine, or no urine. ? Cracked lips. ? Dry mouth. ? Sunken eyes. ? Sleepiness. ? Weakness. This information is not intended to replace advice given to you by your health care provider. Make sure you discuss any questions you have with your health care provider. Document Released: 11/07/2004 Document Revised: 08/18/2015 Document Reviewed: 07/12/2015 Elsevier Interactive Patient Education  [employer].

## 2018-01-20 NOTE — Progress Notes (Deleted)
Subjective:    Patient ID: Stacey Oconnell; adult   DOB: 1961-02-09; 57 y.o.   MRN: [id number]  Chief Complaint  Patient presents with  . Annual Exam    HPI Primary Preventative Screenings: Prostate Cancer: No results found for: PSA1 STI screening:  Colorectal Cancer: Tobacco use/AAA/Lung Cancer/EtOH/Illicit substances:  Cardiac: Weight/Blood sugar/Diet/Exercise: BMI Readings from Last 3 Encounters:  01/20/18 21.21 kg/m  10/02/17 21.93 kg/m  07/21/17 22.40 kg/m   No results found for: HGBA1C OTC/Vit/Supp/Herbal: Dentist/Optho: Immunizations:   There is no immunization history on file for this patient.  Chronic Medical Conditions: ***  Medical History: Past Medical History:  Diagnosis Date  . Allergy    seasonal  . Elevated prostate specific antigen (PSA) 2014   Pt report oncologist at [facility] suspected irritation from avid bike riding, nml since  . Enlarged prostate   . Hyperlipidemia    managed with Mediterranean diet and exercise, +FHx in father  . Syncope    History reviewed. No pertinent surgical history. Current Outpatient Medications on File Prior to Visit  Medication Sig Dispense Refill  . estradiol (ESTRACE) 2 MG tablet Take 1 tablet (2 mg total) by mouth 3 (three) times daily. 270 tablet 1  . spironolactone (ALDACTONE) 100 MG tablet Take 1 tablet (100 mg total) by mouth 2 (two) times daily. 180 tablet 1   No current facility-administered medications on file prior to visit.    No Known Allergies Family History  Problem Relation Age of Onset  . Hyperlipidemia Father   . Heart disease Father   . Hypertension Father   . Diabetes Mother    Social History   Socioeconomic History  . Marital status: Divorced    Spouse name: Not on file  . Number of children: 0  . Years of education: Not on file  . Highest education level: Not on file  Occupational History  . Not on file  Social Needs  . Financial resource strain: Not on file  . Food  insecurity:    Worry: Not on file    Inability: Not on file  . Transportation needs:    Medical: Not on file    Non-medical: Not on file  Tobacco Use  . Smoking status: Never Smoker  . Smokeless tobacco: Never Used  Substance and Sexual Activity  . Alcohol use: Yes    Comment: Social  . Drug use: No  . Sexual activity: Not on file  Lifestyle  . Physical activity:    Days per week: Not on file    Minutes per session: Not on file  . Stress: Not on file  Relationships  . Social connections:    Talks on phone: Not on file    Gets together: Not on file    Attends religious service: Not on file    Active member of club or organization: Not on file    Attends meetings of clubs or organizations: Not on file    Relationship status: Not on file  Other Topics Concern  . Not on file  Social History Narrative  . Not on file   Depression screen [facility] 2/9 10/02/2017 07/21/2017  Decreased Interest 0 0  Down, Depressed, Hopeless 0 0  PHQ - 2 Score 0 0     ROS Otherwise as noted in HPI  Objective:  BP 124/75   Pulse 97   Temp 98 F (36.7 C) (Oral)   Resp 16   Ht 6' 0.84" (1.85 m)   Wt 160 lb (  72.6 kg)   SpO2 97%   BMI 21.21 kg/m   Visual Acuity Screening   Right eye Left eye Both eyes  Without correction:     With correction: 20/20 20/20 20/20    Physical Exam       POC TESTING No visits with results within 3 Day(s) from this visit.  Latest known visit with results is:  Office Visit on 10/02/2017  Component Date Value Ref Range Status  . Color, UA 10/02/2017 yellow  yellow Final  . Clarity, UA 10/02/2017 clear  clear Final  . Glucose, UA 10/02/2017 negative  negative mg/dL Final  . Bilirubin, UA 10/02/2017 negative  negative Final  . Ketones, POC UA 10/02/2017 negative  negative mg/dL Final  . Spec Grav, UA 10/02/2017 1.020  1.010 - 1.025 Final  . Blood, UA 10/02/2017 negative  negative Final  . pH, UA 10/02/2017 7.5  5.0 - 8.0 Final  . Protein Ur, POC  10/02/2017 negative  negative mg/dL Final  . Urobilinogen, UA 10/02/2017 1.0  0.2 or 1.0 E.U./dL Final  . Nitrite, UA 10/02/2017 Negative  Negative Final  . Leukocytes, UA 10/02/2017 Negative  Negative Final  . WBC 10/02/2017 5.5  4.6 - 10.2 K/uL Final  . Lymph, poc 10/02/2017 18.7* 0.6 - 3.4 Final  . POC LYMPH PERCENT 10/02/2017 8.4* 10 - 50 %L Final  . MID (cbc) 10/02/2017 72.9* 0 - 0.9 Final  . POC MID % 10/02/2017 1.0  0 - 12 %M Final  . POC Granulocyte 10/02/2017 0.5* 2 - 6.9 Final  . Granulocyte percent 10/02/2017 4.0* 37 - 80 %G Final  . RBC 10/02/2017 4.30  4.04 - 5.48 M/uL Final  . Hemoglobin 10/02/2017 12.7  12.2 - 16.2 g/dL Final  . HCT, POC 10/02/2017 40.1  37.7 - 47.9 % Final  . MCV 10/02/2017 93.3  80 - 97 fL Final  . MCH, POC 10/02/2017 29.6  27 - 31.2 pg Final  . MCHC 10/02/2017 31.7* 31.8 - 35.4 g/dL Final  . RDW, POC 10/02/2017 12.5  % Final  . Platelet Count, POC 10/02/2017 255  142 - 424 K/uL Final  . MPV 10/02/2017 7.9  0 - 99.8 fL Final  . Glucose 10/02/2017 81  65 - 99 mg/dL Final  . BUN 10/02/2017 13  6 - 24 mg/dL Final  . Creatinine, Ser 10/02/2017 1.29* 0.57 - 1.00 mg/dL Final  . GFR calc non Af Amer 10/02/2017 46* >59 mL/min/1.73 Final  . GFR calc Af Amer 10/02/2017 53* >59 mL/min/1.73 Final  . BUN/Creatinine Ratio 10/02/2017 10  9 - 23 Final  . Sodium 10/02/2017 138  134 - 144 mmol/L Final  . Potassium 10/02/2017 5.1  3.5 - 5.2 mmol/L Final  . Chloride 10/02/2017 102  96 - 106 mmol/L Final  . CO2 10/02/2017 22  20 - 29 mmol/L Final  . Calcium 10/02/2017 9.6  8.7 - 10.2 mg/dL Final  . Total Protein 10/02/2017 6.6  6.0 - 8.5 g/dL Final  . Albumin 10/02/2017 4.3  3.5 - 5.5 g/dL Final  . Globulin, Total 10/02/2017 2.3  1.5 - 4.5 g/dL Final  . Albumin/Globulin Ratio 10/02/2017 1.9  1.2 - 2.2 Final  . Bilirubin Total 10/02/2017 0.2  0.0 - 1.2 mg/dL Final  . Alkaline Phosphatase 10/02/2017 29* 39 - 117 IU/L Final  . AST 10/02/2017 14  0 - 40 IU/L Final  .  ALT 10/02/2017 13  0 - 32 IU/L Final     Assessment & Plan:  No diagnosis  found.  ***  Patient will continue on current chronic medications other than changes noted above, so ok to refill when needed.   Reviewed all health maintenance recommendations per USPSTF guidelines.   See after visit summary for patient specific instructions.  No orders of the defined types were placed in this encounter.   No orders of the defined types were placed in this encounter.   Patient verbalized to me that they understand the following: diagnosis, what is being done for them, what to expect and what should be done at home.  Their questions have been answered. They understand that I am unable to predict every possible medication interaction or adverse outcome and that if any unexpected symptoms arise, they should contact us and their pharmacist, as well as never hesitate to seek urgent/emergent care at [facility] or ER if they think it might be warranted.    Delman Cheadle, MD, MPH Primary Care at [place] [address] [place], [place]  [address] [phone] Office phone  [phone] Office fax   01/20/18 8:13 AM

## 2018-02-02 ENCOUNTER — Encounter: Payer: Self-pay | Admitting: Family Medicine

## 2018-02-05 ENCOUNTER — Ambulatory Visit
Admission: RE | Admit: 2018-02-05 | Discharge: 2018-02-05 | Disposition: A | Discharge status: Home / Self Care | Payer: BLUE CROSS/BLUE SHIELD | Source: Ambulatory Visit | Attending: Family Medicine | Admitting: Family Medicine

## 2018-02-05 ENCOUNTER — Encounter: Payer: Self-pay | Admitting: Radiology

## 2018-02-05 DIAGNOSIS — Z1231 Encounter for screening mammogram for malignant neoplasm of breast: Secondary | ICD-10-CM

## 2018-02-05 DIAGNOSIS — R1012 Left upper quadrant pain: Secondary | ICD-10-CM

## 2018-02-10 ENCOUNTER — Encounter: Payer: Self-pay | Admitting: Family Medicine

## 2018-02-17 ENCOUNTER — Telehealth: Payer: Self-pay | Admitting: Gastroenterology

## 2018-02-17 NOTE — Telephone Encounter (Signed)
Received GI records from [facility] and placed on Dr. Bryan Lemma desk for review you are the doctor of the day for 01/20/2018 when referral  Was created.

## 2018-02-20 NOTE — Telephone Encounter (Signed)
Dr.Cirigliano reviewed records and recommends patient come in for an office visit to discuss repeat colonoscopy due to family hx of colon polyps. Left message for patient to call back and schedule an appointment.

## 2018-02-24 ENCOUNTER — Encounter: Payer: Self-pay | Admitting: Gastroenterology

## 2018-03-05 ENCOUNTER — Encounter: Payer: Self-pay | Admitting: Gastroenterology

## 2018-03-05 ENCOUNTER — Ambulatory Visit: Payer: BLUE CROSS/BLUE SHIELD | Admitting: Gastroenterology

## 2018-03-05 VITALS — BP 100/72 | HR 90 | Ht 73.0 in | Wt 160.5 lb

## 2018-03-05 DIAGNOSIS — Z8371 Family history of colonic polyps: Secondary | ICD-10-CM | POA: Diagnosis not present

## 2018-03-05 DIAGNOSIS — K921 Melena: Secondary | ICD-10-CM | POA: Diagnosis not present

## 2018-03-05 DIAGNOSIS — Z1211 Encounter for screening for malignant neoplasm of colon: Secondary | ICD-10-CM | POA: Diagnosis not present

## 2018-03-05 DIAGNOSIS — Z1212 Encounter for screening for malignant neoplasm of rectum: Secondary | ICD-10-CM

## 2018-03-05 MED ORDER — SOD PICOSULFATE-MAG OX-CIT ACD 10-3.5-12 MG-GM -GM/160ML PO SOLN: 1.0000 | ORAL | 0 refills | Status: DC

## 2018-03-05 NOTE — Patient Instructions (Signed)
If you are age 58 or older, your body mass index should be between 23-30. Your Body mass index is 21.18 kg/m. If this is out of the aforementioned range listed, please consider follow up with your Primary Care Provider.  If you are age 35 or younger, your body mass index should be between 19-25. Your Body mass index is 21.18 kg/m. If this is out of the aformentioned range listed, please consider follow up with your Primary Care Provider.   You have been scheduled for a colonoscopy. Please follow written instructions given to you at your visit today.  Please pick up your prep supplies at the pharmacy within the next 1-3 days. If you use inhalers (even only as needed), please bring them with you on the day of your procedure. Your physician has requested that you go to www.startemmi.com and enter the access code given to you at your visit today. This web site gives a general overview about your procedure. However, you should still follow specific instructions given to you by our office regarding your preparation for the procedure.  We have sent the following medications to your pharmacy for you to pick up at your convenience: Clenpiq  It was a pleasure to see you today!  Vito Cirigliano, D.O.

## 2018-03-05 NOTE — Progress Notes (Signed)
Chief Complaint: Colon cancer screening, hematochezia  Referring Provider:     Shawnee Knapp, MD  HPI:    Stacey Oconnell is a 58 y.o. adult referred to the [facility] for evaluation of ongoing CRC screening. Does report recent episode of self limiting, small-volume hematochezia last month. No associated abd pain. Otherwise, good PO intake without any n/v/d/c.  Last colonoscopy was 03/2012 at [facility] and was normal recommendation repeat in 5 years due to family history.  FHx n/f father with colon polyps requiring surgical resection (patient thinks aggressive histology polyp, but not CRC) and ?SBO x2 over the last year or so.  Endoscopic history: - Colonoscopy (03/2012, Dr. Candace Cruise): Normal.  Recommended repeat in 5 years for surveillance due to family history  Past Medical History:  Diagnosis Date  . Allergy    seasonal  . Elevated prostate specific antigen (PSA) 2014   Pt report oncologist at [facility] suspected irritation from avid bike riding, nml since  . Enlarged prostate   . Hyperlipidemia    managed with Mediterranean diet and exercise, +FHx in father  . Syncope      History reviewed. No pertinent surgical history. Family History  Problem Relation Age of Onset  . Hyperlipidemia Father   . Heart disease Father   . Hypertension Father   . Colon polyps Father   . Diabetes Mother   . Breast cancer Paternal Aunt    Social History   Tobacco Use  . Smoking status: Never Smoker  . Smokeless tobacco: Never Used  Substance Use Topics  . Alcohol use: Yes    Comment: Social  . Drug use: No   Current Outpatient Medications  Medication Sig Dispense Refill  . estradiol (ESTRACE) 2 MG tablet Take 1 tablet (2 mg total) by mouth 3 (three) times daily. 270 tablet 3  . pantoprazole (PROTONIX) 40 MG tablet Take 1 tablet (40 mg total) by mouth daily. 30 tablet 1  . spironolactone (ALDACTONE) 100 MG tablet Take 1 tablet (100 mg total) by  mouth 2 (two) times daily. 180 tablet 3   No current facility-administered medications for this visit.    Not on File   Review of Systems: All systems reviewed and negative except where noted in HPI.     Physical Exam:    Wt Readings from Last 3 Encounters:  03/05/18 160 lb 8 oz (72.8 kg)  01/20/18 160 lb (72.6 kg)  10/02/17 166 lb 3.2 oz (75.4 kg)    BP 100/72   Pulse 90   Ht 6\' 1"  (1.854 m)   Wt 160 lb 8 oz (72.8 kg)   BMI 21.18 kg/m  Constitutional:  Pleasant, in no acute distress. Psychiatric: Normal mood and affect. Behavior is normal. EENT: Pupils normal.  Conjunctivae are normal. No scleral icterus. Neck supple. No cervical LAD. Cardiovascular: Normal rate, regular rhythm. No edema Pulmonary/chest: Effort normal and breath sounds normal. No wheezing, rales or rhonchi. Abdominal: Soft, nondistended, nontender. Bowel sounds active throughout. There are no masses palpable. No hepatomegaly. Neurological: Alert and oriented to person place and time. Skin: Skin is warm and dry. No rashes noted.   ASSESSMENT AND PLAN;   Stacey Oconnell is a 58 y.o. adult presenting with:  1) CRC screening: Last colonoscopy completed in 2014 and was normal.  However, given family history of advanced polyp requiring surgical resection, was recommended repeat in 5 years.  Per current  GI societal guidelines, and per prior Gastroenterologist's recommendation, agree with repeat colonoscopy at this time. - Scheduled for colonoscopy  2) FHx of advanced polyp: Per GI societal guidelines, consideration for short interval screening/surveillance in patients with first-degree relatives with advanced adenoma which is certainly the case here.  3) Hematochezia: Recent episode of self-limiting painless hematochezia.  Likely benign anal rectal etiology (i.e. hemorrhoids).  Will evaluate for etiology at time of colonoscopy as above.  Otherwise no recent recurrence.  The indications, risks, and  benefits of colonoscopy were explained to the patient in detail. Risks include but are not limited to bleeding, perforation, adverse reaction to medications, and cardiopulmonary compromise. Sequelae include but are not limited to the possibility of surgery, hospitalization, and mortality. The patient verbalized understanding and wished to proceed. All questions answered, referred to the scheduler and bowel prep ordered. Further recommendations pending results of the exam.    Lavena Bullion, DO, FACG  03/05/2018, 9:46 AM   Shawnee Knapp, MD

## 2018-03-13 ENCOUNTER — Ambulatory Visit (AMBULATORY_SURGERY_CENTER): Payer: BLUE CROSS/BLUE SHIELD | Admitting: Gastroenterology

## 2018-03-13 ENCOUNTER — Encounter: Payer: Self-pay | Admitting: Gastroenterology

## 2018-03-13 VITALS — BP 99/58 | HR 81 | Temp 97.8°F | Resp 19 | Ht 73.0 in | Wt 160.0 lb

## 2018-03-13 DIAGNOSIS — K621 Rectal polyp: Secondary | ICD-10-CM

## 2018-03-13 DIAGNOSIS — K5730 Diverticulosis of large intestine without perforation or abscess without bleeding: Secondary | ICD-10-CM

## 2018-03-13 DIAGNOSIS — Z8371 Family history of colonic polyps: Secondary | ICD-10-CM | POA: Diagnosis not present

## 2018-03-13 DIAGNOSIS — Z1212 Encounter for screening for malignant neoplasm of rectum: Secondary | ICD-10-CM | POA: Diagnosis not present

## 2018-03-13 DIAGNOSIS — D129 Benign neoplasm of anus and anal canal: Secondary | ICD-10-CM

## 2018-03-13 DIAGNOSIS — D124 Benign neoplasm of descending colon: Secondary | ICD-10-CM | POA: Diagnosis not present

## 2018-03-13 DIAGNOSIS — D128 Benign neoplasm of rectum: Secondary | ICD-10-CM

## 2018-03-13 DIAGNOSIS — D120 Benign neoplasm of cecum: Secondary | ICD-10-CM | POA: Diagnosis not present

## 2018-03-13 DIAGNOSIS — Z1211 Encounter for screening for malignant neoplasm of colon: Secondary | ICD-10-CM

## 2018-03-13 MED ORDER — SODIUM CHLORIDE 0.9 % IV SOLN: 500.0000 mL | INTRAVENOUS | Status: DC

## 2018-03-13 NOTE — Op Note (Addendum)
[place] Patient Name: Stacey Oconnell Procedure Date: 03/13/2018 11:22 AM MRN: [id number] Endoscopist: Gerrit Heck , MD Age: 58 Referring MD:  Date of Birth: [birth year] Gender: Female Account #: [id number] Procedure:                Colonoscopy Indications:              Colon cancer screening in patient with 1st-degree                            relative having advanced adenoma of the colon;                            family history notable for father with advanced                            adenoma requiring surgical resection. Last                            colonoscopy in 2014 was normal/no polyps, with                            recommendation to repeat in 5 years due to family                            history. Otherwise, no active lower GI symptoms. Medicines:                Monitored Anesthesia Care Procedure:                Pre-Anesthesia Assessment:                           - Prior to the procedure, a History and Physical                            was performed, and patient medications and                            allergies were reviewed. The patient's tolerance of                            previous anesthesia was also reviewed. The risks                            and benefits of the procedure and the sedation                            options and risks were discussed with the patient.                            All questions were answered, and informed consent                            was obtained. Prior Anticoagulants: The patient has  taken no previous anticoagulant or antiplatelet                            agents. ASA Grade Assessment: II - A patient with                            mild systemic disease. After reviewing the risks                            and benefits, the patient was deemed in                            satisfactory condition to undergo the procedure.                           After obtaining  informed consent, the colonoscope                            was passed under direct vision. Throughout the                            procedure, the patient's blood pressure, pulse, and                            oxygen saturations were monitored continuously. The                            Colonoscope was introduced through the anus and                            advanced to the the terminal ileum. The colonoscopy                            was performed without difficulty. The patient                            tolerated the procedure well. The quality of the                            bowel preparation was adequate. The terminal ileum,                            ileocecal valve, appendiceal orifice, and rectum                            were photographed. Scope In: 11:24:04 AM Scope Out: 11:42:35 AM Scope Withdrawal Time: 0 hours 13 minutes 54 seconds  Total Procedure Duration: 0 hours 18 minutes 31 seconds  Findings:                 The perianal and digital rectal examinations were                            normal.  Three sessile polyps were found in the descending                            colon and cecum. The polyps were 2 to 4 mm in size.                            These polyps were removed with a cold snare.                            Resection and retrieval were complete. Estimated                            blood loss was minimal.                           A 2 mm polyp was found in the rectum. The polyp was                            sessile. The polyp was removed with a cold biopsy                            forceps. Resection and retrieval were complete.                            Estimated blood loss was minimal.                           Multiple small and large-mouthed diverticula were                            found in the sigmoid colon, descending colon,                            transverse colon and ascending colon.                            Anal papilla(e) were hypertrophied.                           Retroflexion in the right colon was performed.                           The terminal ileum appeared normal. Complications:            No immediate complications. Estimated Blood Loss:     Estimated blood loss was minimal. Impression:               - Three 2 to 4 mm polyps in the descending colon                            and in the cecum, removed with a cold snare.                            Resected and retrieved.                           -  One 2 mm polyp in the rectum, removed with a cold                            biopsy forceps. Resected and retrieved.                           - Diverticulosis in the sigmoid colon, in the                            descending colon, in the transverse colon and in                            the ascending colon.                           - Anal papilla(e) were hypertrophied.                           - The examined portion of the ileum was normal. Recommendation:           - Patient has a contact number available for                            emergencies. The signs and symptoms of potential                            delayed complications were discussed with the                            patient. Return to normal activities tomorrow.                            Written discharge instructions were provided to the                            patient.                           - Resume previous diet today.                           - Continue present medications.                           - Await pathology results.                           - Repeat colonoscopy in 3 - 5 years for                            surveillance based on pathology results.                           - Return to GI office PRN. Gerrit Heck, MD 03/13/2018 11:50:11 AM

## 2018-03-13 NOTE — Patient Instructions (Signed)
YOU HAD AN ENDOSCOPIC PROCEDURE TODAY AT THE [facility]:   Refer to the procedure report that was given to you for any specific questions about what was found during the examination.  If the procedure report does not answer your questions, please call your gastroenterologist to clarify.  If you requested that your care partner not be given the details of your procedure findings, then the procedure report has been included in a sealed envelope for you to review at your convenience later.  YOU SHOULD EXPECT: Some feelings of bloating in the abdomen. Passage of more gas than usual.  Walking can help get rid of the air that was put into your GI tract during the procedure and reduce the bloating. If you had a lower endoscopy (such as a colonoscopy or flexible sigmoidoscopy) you may notice spotting of blood in your stool or on the toilet paper. If you underwent a bowel prep for your procedure, you may not have a normal bowel movement for a few days.  Please Note:  You might notice some irritation and congestion in your nose or some drainage.  This is from the oxygen used during your procedure.  There is no need for concern and it should clear up in a day or so.  SYMPTOMS TO REPORT IMMEDIATELY:   Following lower endoscopy (colonoscopy or flexible sigmoidoscopy):  Excessive amounts of blood in the stool  Significant tenderness or worsening of abdominal pains  Swelling of the abdomen that is new, acute  Fever of 100F or higher  For urgent or emergent issues, a gastroenterologist can be reached at any hour by calling [phone].   DIET:  We do recommend a small meal at first, but then you may proceed to your regular diet.  Drink plenty of fluids but you should avoid alcoholic beverages for 24 hours.  ACTIVITY:  You should plan to take it easy for the rest of today and you should NOT DRIVE or use heavy machinery until tomorrow (because of the sedation medicines used during the test).     FOLLOW UP: Our staff will call the number listed on your records the next business day following your procedure to check on you and address any questions or concerns that you may have regarding the information given to you following your procedure. If we do not reach you, we will leave a message.  However, if you are feeling well and you are not experiencing any problems, there is no need to return our call.  We will assume that you have returned to your regular daily activities without incident.  If any biopsies were taken you will be contacted by phone or by letter within the next 1-3 weeks.  Please call us at [phone] if you have not heard about the biopsies in 3 weeks.    SIGNATURES/CONFIDENTIALITY: You and/or your care partner have signed paperwork which will be entered into your electronic medical record.  These signatures attest to the fact that that the information above on your After Visit Summary has been reviewed and is understood.  Full responsibility of the confidentiality of this discharge information lies with you and/or your care-partner. 

## 2018-03-13 NOTE — Progress Notes (Signed)
Called to room to assist during endoscopic procedure.  Patient ID and intended procedure confirmed with present staff. Received instructions for my participation in the procedure from the performing physician.  

## 2018-03-13 NOTE — Progress Notes (Signed)
A and O x3. Report to RN. Tolerated MAC anesthesia well.

## 2018-03-16 ENCOUNTER — Telehealth: Payer: Self-pay | Admitting: *Deleted

## 2018-03-16 NOTE — Telephone Encounter (Signed)
  Follow up Call-  Call back number 03/13/2018  Post procedure Call Back phone  # [phone]  Permission to leave phone message Yes  Some recent data might be hidden     Patient questions:  Do you have a fever, pain , or abdominal swelling? No. Pain Score  0 *  Have you tolerated food without any problems? Yes.    Have you been able to return to your normal activities? Yes.    Do you have any questions about your discharge instructions: Diet   No. Medications  No. Follow up visit  No.  Do you have questions or concerns about your Care? No.  Actions: * If pain score is 4 or above: No action needed, pain <4.

## 2018-03-19 ENCOUNTER — Encounter: Payer: Self-pay | Admitting: Gastroenterology

## 2018-07-23 ENCOUNTER — Encounter: Payer: BLUE CROSS/BLUE SHIELD | Admitting: Family Medicine

## 2019-01-04 ENCOUNTER — Other Ambulatory Visit: Payer: Self-pay | Admitting: Family Medicine

## 2019-01-04 DIAGNOSIS — Z1231 Encounter for screening mammogram for malignant neoplasm of breast: Secondary | ICD-10-CM

## 2019-01-25 ENCOUNTER — Other Ambulatory Visit: Payer: Self-pay | Admitting: Family Medicine

## 2019-01-25 ENCOUNTER — Ambulatory Visit
Admission: RE | Admit: 2019-01-25 | Discharge: 2019-01-25 | Disposition: A | Discharge status: Home / Self Care | Payer: BC Managed Care – PPO | Source: Ambulatory Visit | Attending: Family Medicine | Admitting: Family Medicine

## 2019-01-25 DIAGNOSIS — M62011 Separation of muscle (nontraumatic), right shoulder: Secondary | ICD-10-CM

## 2019-02-24 ENCOUNTER — Ambulatory Visit
Admission: RE | Admit: 2019-02-24 | Discharge: 2019-02-24 | Disposition: A | Discharge status: Home / Self Care | Payer: BLUE CROSS/BLUE SHIELD | Source: Ambulatory Visit | Attending: Family Medicine | Admitting: Family Medicine

## 2019-02-24 ENCOUNTER — Other Ambulatory Visit: Payer: Self-pay

## 2019-02-24 DIAGNOSIS — Z1231 Encounter for screening mammogram for malignant neoplasm of breast: Secondary | ICD-10-CM

## 2019-03-27 IMAGING — US US ABDOMEN COMPLETE
1 series · 14 of 25 positions shown · non-contrast
Comparison: None.

CLINICAL DATA: Persistent left upper quadrant pain for 4 months.

EXAM:
ABDOMEN ULTRASOUND COMPLETE

[Series 1: us abdomen complete · 0.19mm/px · 14 of 80 slices shown]
[im 1/80]
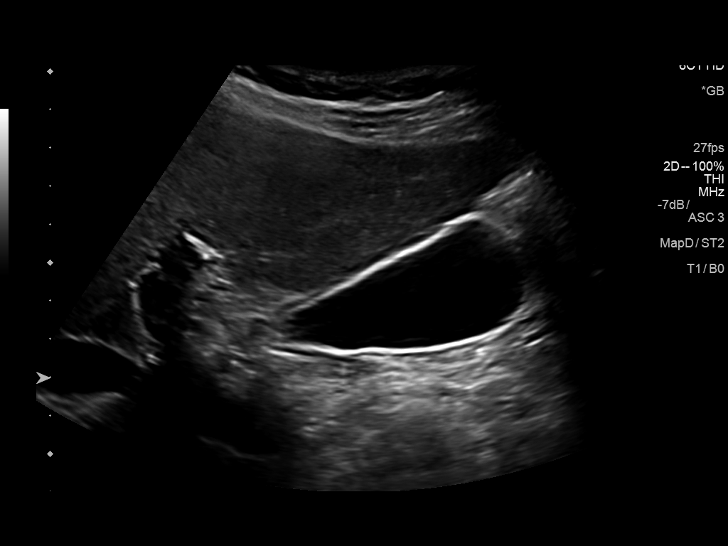
[im 7/80]
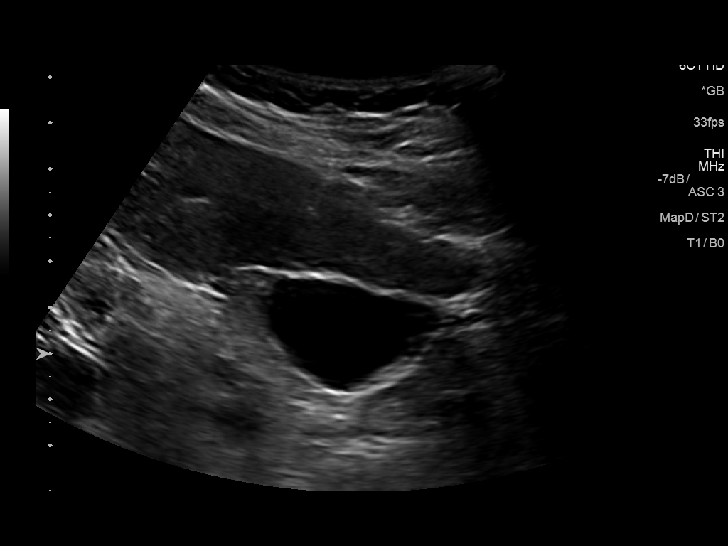
[im 14/80]
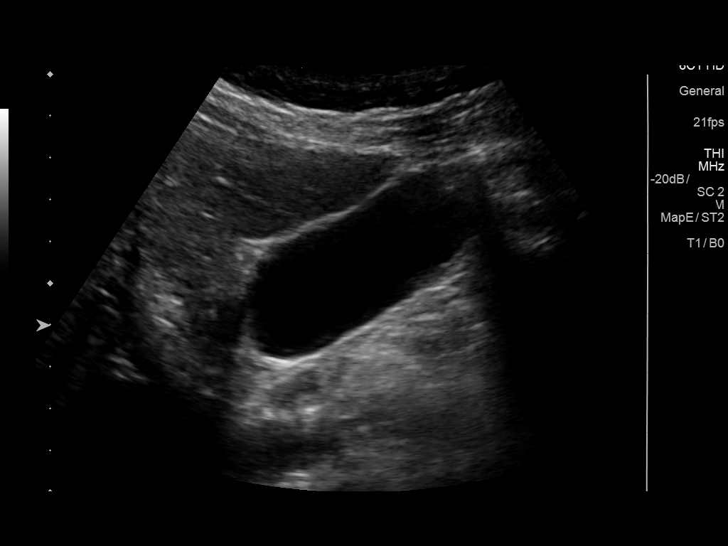
[im 20/80]
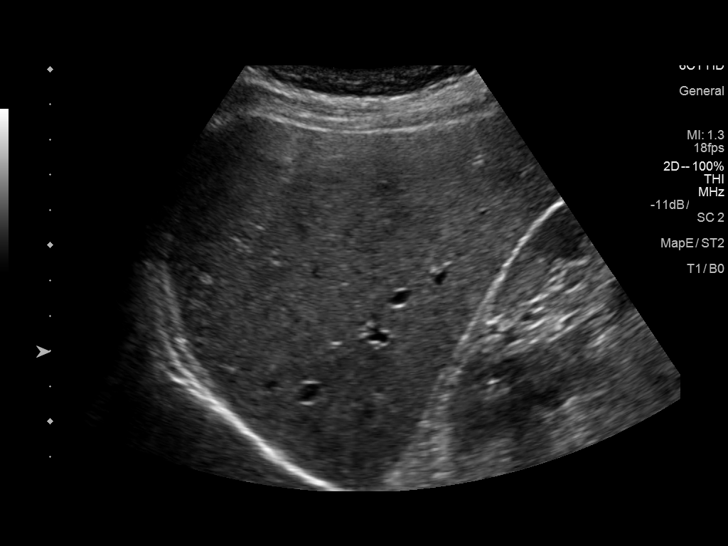
[im 27/80]
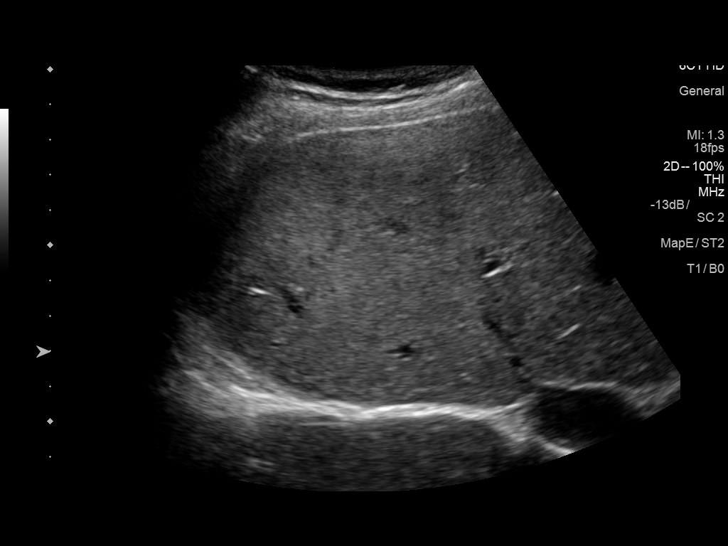
[im 30/80]
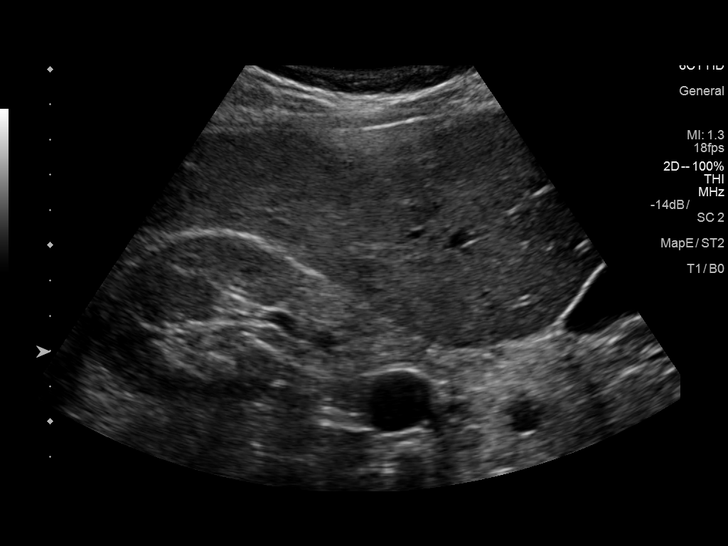
[im 37/80]
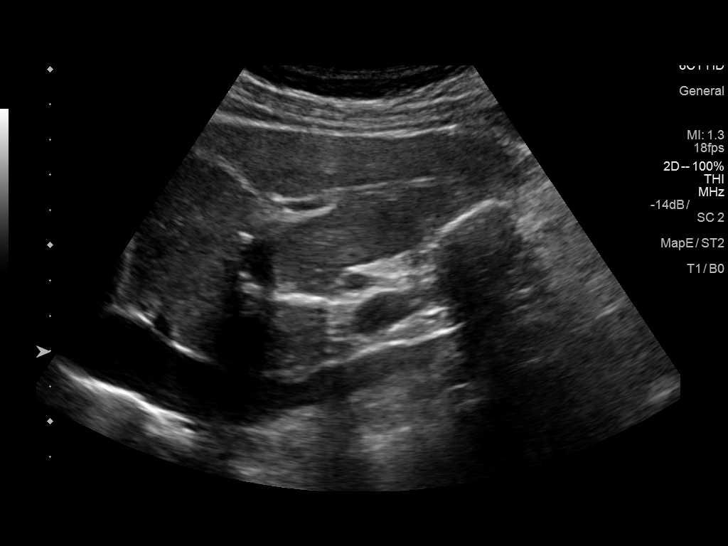
[im 43/80]
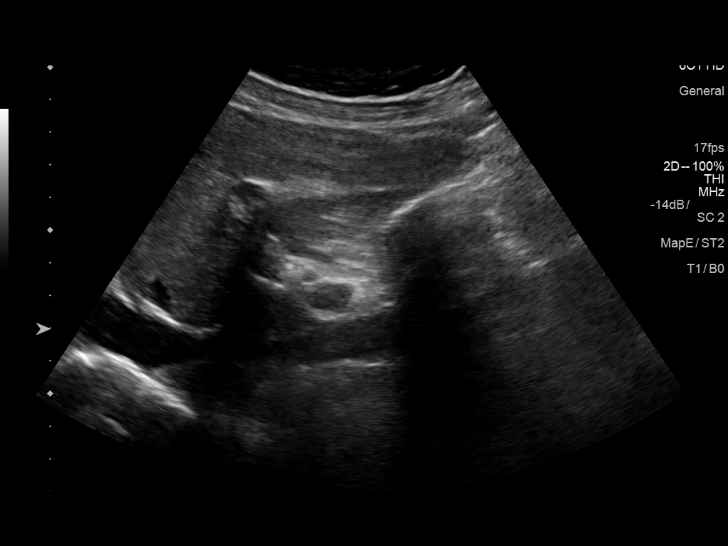
[im 50/80]
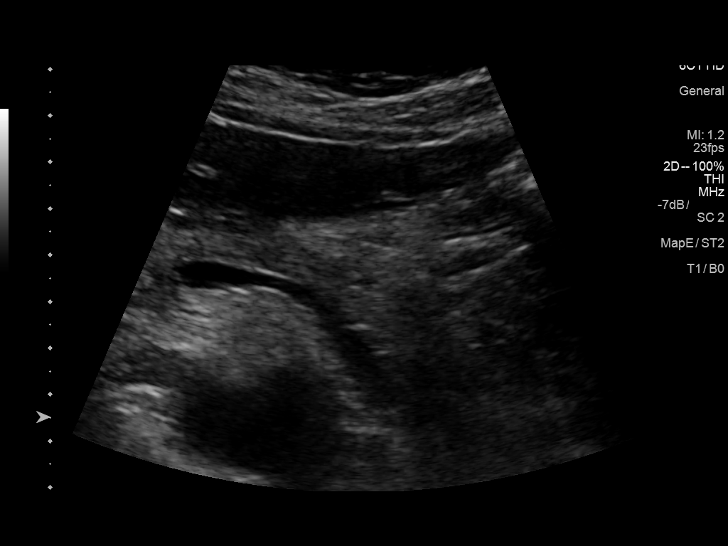
[im 53/80]
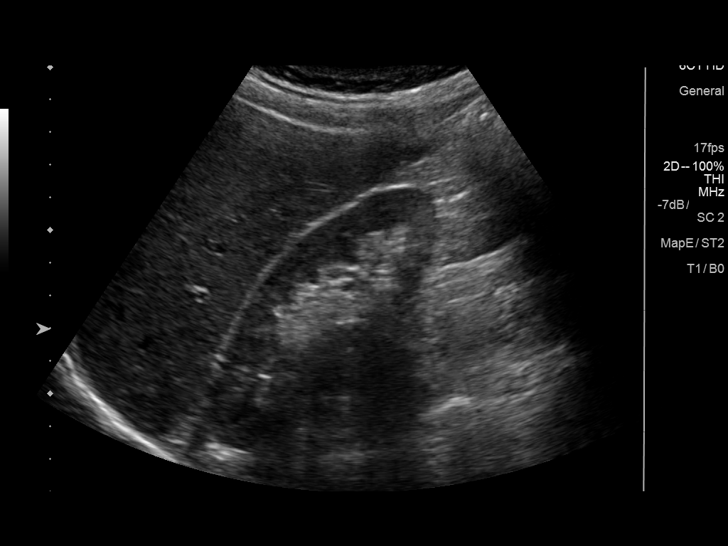
[im 60/80]
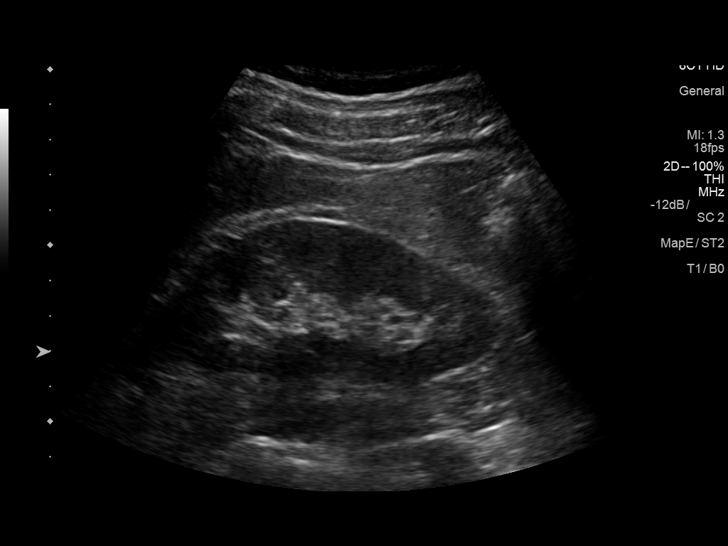
[im 66/80]
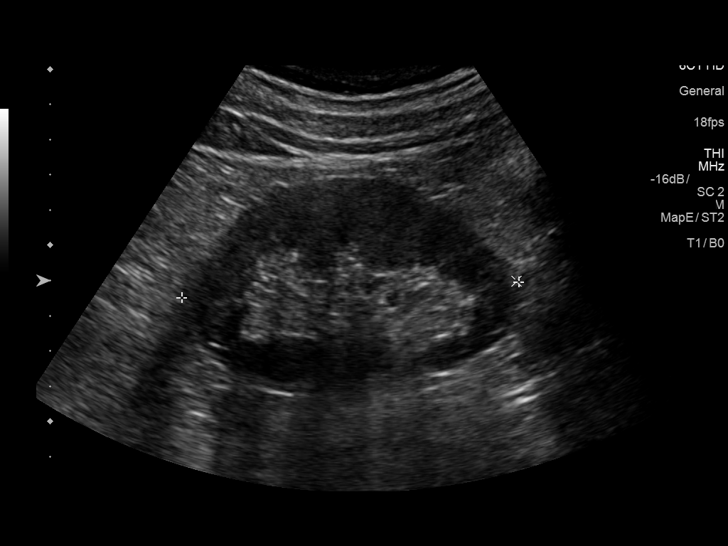
[im 73/80]
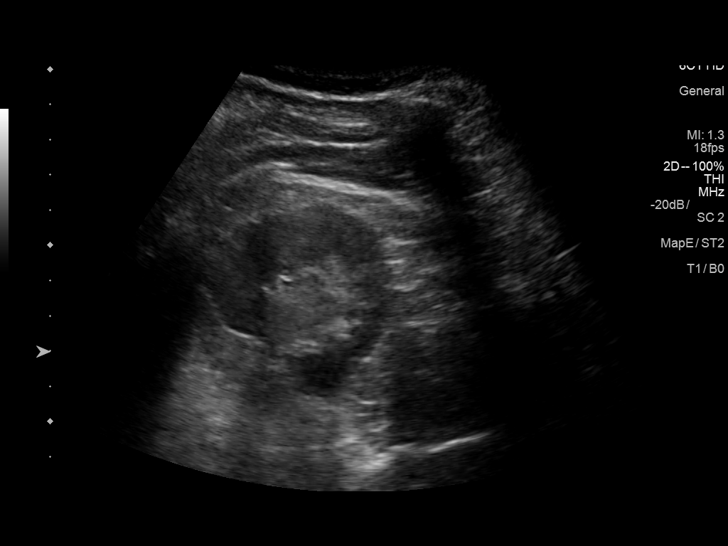
[im 80/80]
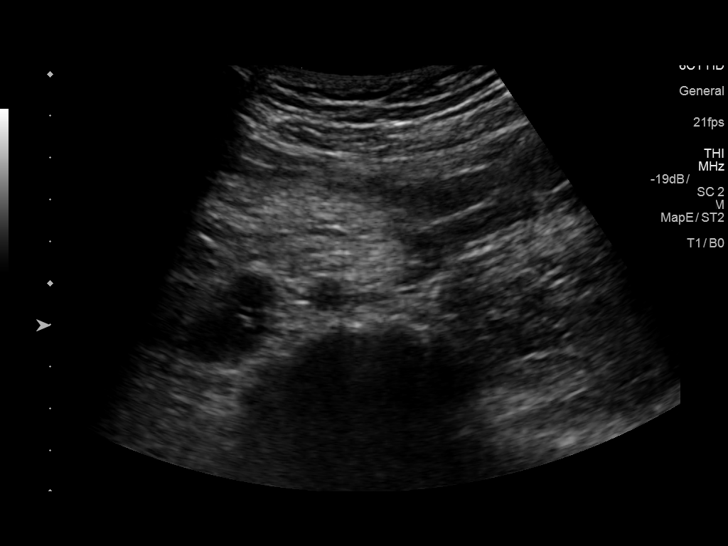

[14 of 25 positions shown; findings below may reference images not displayed]

FINDINGS: Gallbladder: No gallstones or wall thickening visualized. No
sonographic Murphy sign noted by sonographer.

Common bile duct: Diameter: 2 mm, within normal limits.

Liver: No focal lesion identified. Within normal limits in
parenchymal echogenicity. Portal vein is patent on color Doppler
imaging with normal direction of blood flow towards the liver.

IVC: No abnormality visualized.

Pancreas: Visualized portion unremarkable.

Spleen: Size and appearance within normal limits.

Right Kidney: Length: 10.5 cm. Echogenicity within normal limits. No
mass or hydronephrosis visualized.

Left Kidney: Length: 9.6 cm. Echogenicity within normal limits. No
mass or hydronephrosis visualized.

Abdominal aorta: No aneurysm visualized.

Other findings: Unremarkable appearance of urinary bladder.
IMPRESSION: Normal exam.

## 2020-01-21 ENCOUNTER — Other Ambulatory Visit: Payer: Self-pay | Admitting: Family Medicine

## 2020-01-21 DIAGNOSIS — Z1231 Encounter for screening mammogram for malignant neoplasm of breast: Secondary | ICD-10-CM

## 2020-01-31 ENCOUNTER — Encounter: Payer: Self-pay | Admitting: Dermatology

## 2020-01-31 ENCOUNTER — Ambulatory Visit (INDEPENDENT_AMBULATORY_CARE_PROVIDER_SITE_OTHER): Payer: BC Managed Care – PPO | Admitting: Dermatology

## 2020-01-31 ENCOUNTER — Other Ambulatory Visit: Payer: Self-pay

## 2020-01-31 DIAGNOSIS — Z1283 Encounter for screening for malignant neoplasm of skin: Secondary | ICD-10-CM | POA: Diagnosis not present

## 2020-01-31 DIAGNOSIS — L820 Inflamed seborrheic keratosis: Secondary | ICD-10-CM

## 2020-01-31 DIAGNOSIS — L821 Other seborrheic keratosis: Secondary | ICD-10-CM

## 2020-01-31 DIAGNOSIS — L578 Other skin changes due to chronic exposure to nonionizing radiation: Secondary | ICD-10-CM

## 2020-01-31 DIAGNOSIS — D225 Melanocytic nevi of trunk: Secondary | ICD-10-CM

## 2020-01-31 DIAGNOSIS — Z86018 Personal history of other benign neoplasm: Secondary | ICD-10-CM

## 2020-01-31 DIAGNOSIS — L720 Epidermal cyst: Secondary | ICD-10-CM

## 2020-01-31 DIAGNOSIS — D235 Other benign neoplasm of skin of trunk: Secondary | ICD-10-CM

## 2020-01-31 NOTE — Progress Notes (Signed)
   Follow-Up Visit   Subjective  Stacey Oconnell is a 59 y.o. adult who presents for the following: TBSE (History of dysplastic nevi. No spots of concern today.).  Has growth in hairline that gets irritated.   The following portions of the chart were reviewed this encounter and updated as appropriate:       Review of Systems:  No other skin or systemic complaints except as noted in HPI or Assessment and Plan.  Objective  Well appearing patient in no apparent distress; mood and affect are within normal limits.  A full examination was performed including scalp, head, eyes, ears, nose, lips, neck, chest, axillae, abdomen, back, buttocks, bilateral upper extremities, bilateral lower extremities, hands, feet, fingers, toes, fingernails, and toenails. All findings within normal limits unless otherwise noted below.  Objective  Right temple hairline, left parietal scalp: Erythematous keratotic or waxy stuck-on papule or plaque.   Objective  Posterior Neck: Subcutaneous nodule.    Assessment & Plan   Skin cancer screening performed today.  Actinic Damage - chronic, secondary to cumulative UV radiation exposure/sun exposure over time - diffuse scaly erythematous macules with underlying dyspigmentation - Recommend daily broad spectrum sunscreen SPF 30+ to sun-exposed areas, reapply every 2 hours as needed.  - Call for new or changing lesions.  Lentigines - Scattered tan macules - Discussed due to sun exposure - Benign, observe - Recommend daily broad spectrum sunscreen SPF 30+ to sun-exposed areas, reapply every 2 hours as needed. - Call for any changes  Dermatofibroma - Firm pink/brown papulenodule with dimple sign of the right posterior shoulder, left posterior axilla - Benign appearing - Call for any changes  Melanocytic Nevi - Tan-brown and/or pink-flesh-colored symmetric macules and papules, including small dark brown macules on back - Benign appearing on exam today -  Observation - Call clinic for new or changing moles - Recommend daily use of broad spectrum spf 30+ sunscreen to sun-exposed areas.   History of Dysplastic Nevi - No evidence of recurrence today - Recommend regular full body skin exams - Recommend daily broad spectrum sunscreen SPF 30+ to sun-exposed areas, reapply every 2 hours as needed.  - Call if any new or changing lesions are noted between office visits  Seborrheic Keratoses - Stuck-on, waxy, tan-brown papules and plaques  - Discussed benign etiology and prognosis. - Observe - Call for any changes  Inflamed seborrheic keratosis Right temple hairline, left parietal scalp  Will plan to treat right temple hairline with LN2 on follow-up.  Epidermal inclusion cyst Posterior Neck  Benign-appearing. Exam most consistent with an epidermal inclusion cyst. Discussed that a cyst is a benign growth that can grow over time and sometimes get irritated or inflamed. Recommend observation if it is not bothersome. Discussed option of surgical excision to remove it if it is growing, symptomatic, or other changes noted. Please call for new or changing lesions so they can be evaluated.    Return in about 1 year (around 01/30/2021) for sooner for LN2 of ISK.  I, Jamesetta Orleans, CMA, am acting as scribe for Brendolyn Patty, MD .  Documentation: I have reviewed the above documentation for accuracy and completeness, and I agree with the above.  Brendolyn Patty MD

## 2020-01-31 NOTE — Patient Instructions (Signed)
Recommend daily broad spectrum sunscreen SPF 30+ to sun-exposed areas, reapply every 2 hours as needed. Call for new or changing lesions.  

## 2020-03-03 ENCOUNTER — Ambulatory Visit: Payer: BC Managed Care – PPO

## 2020-03-17 ENCOUNTER — Ambulatory Visit
Admission: RE | Admit: 2020-03-17 | Discharge: 2020-03-17 | Disposition: A | Discharge status: Home / Self Care | Payer: BC Managed Care – PPO | Source: Ambulatory Visit | Attending: Family Medicine | Admitting: Family Medicine

## 2020-03-17 ENCOUNTER — Other Ambulatory Visit: Payer: Self-pay

## 2020-03-17 DIAGNOSIS — Z1231 Encounter for screening mammogram for malignant neoplasm of breast: Secondary | ICD-10-CM

## 2020-04-17 ENCOUNTER — Ambulatory Visit (INDEPENDENT_AMBULATORY_CARE_PROVIDER_SITE_OTHER): Payer: BC Managed Care – PPO | Admitting: Dermatology

## 2020-04-17 ENCOUNTER — Other Ambulatory Visit: Payer: Self-pay

## 2020-04-17 DIAGNOSIS — L821 Other seborrheic keratosis: Secondary | ICD-10-CM

## 2020-04-17 DIAGNOSIS — L578 Other skin changes due to chronic exposure to nonionizing radiation: Secondary | ICD-10-CM

## 2020-04-17 DIAGNOSIS — L820 Inflamed seborrheic keratosis: Secondary | ICD-10-CM | POA: Diagnosis not present

## 2020-04-17 NOTE — Progress Notes (Signed)
   Follow-Up Visit   Subjective  Stacey Oconnell is a 60 y.o. adult who presents for the following: ISK to treat (R templ hairline, pt presents to have treated).  It gets irritated when combing hair.   The following portions of the chart were reviewed this encounter and updated as appropriate:       Review of Systems:  No other skin or systemic complaints except as noted in HPI or Assessment and Plan.  Objective  Well appearing patient in no apparent distress; mood and affect are within normal limits.  A focused examination was performed including face/scalp. Relevant physical exam findings are noted in the Assessment and Plan.  Objective  R temple hairline x 1: Erythematous keratotic or waxy stuck-on plaque.    Assessment & Plan  Inflamed seborrheic keratosis R temple hairline x 1  Destruction of lesion - R temple hairline x 1  Destruction method: cryotherapy   Informed consent: discussed and consent obtained   Lesion destroyed using liquid nitrogen: Yes   Region frozen until ice ball extended beyond lesion: Yes   Outcome: patient tolerated procedure well with no complications   Post-procedure details: wound care instructions given    Actinic Damage - chronic, secondary to cumulative UV radiation exposure/sun exposure over time - diffuse scaly erythematous macules with underlying dyspigmentation - Recommend daily broad spectrum sunscreen SPF 30+ to sun-exposed areas, reapply every 2 hours as needed.  - Recommend staying in the shade or wearing long sleeves, sun glasses (UVA+UVB protection) and wide brim hats (4-inch brim around the entire circumference of the hat). - Call for new or changing lesions.  Seborrheic Keratoses - Stuck-on, waxy, tan-brown papules - Discussed benign etiology and prognosis. - Observe - Call for any changes   Return for as scheduled for TBSE 01/29/2021.  I, Othelia Pulling, RMA, am acting as scribe for Brendolyn Patty, MD . Documentation: I  have reviewed the above documentation for accuracy and completeness, and I agree with the above.  Brendolyn Patty MD

## 2020-10-28 IMAGING — MG DIGITAL SCREENING BILATERAL MAMMOGRAM WITH CAD
4 series · 4 of 4 positions shown · non-contrast
Comparison: None.

CLINICAL DATA: Screening.

EXAM:
DIGITAL SCREENING BILATERAL MAMMOGRAM WITH CAD

[L MLO]
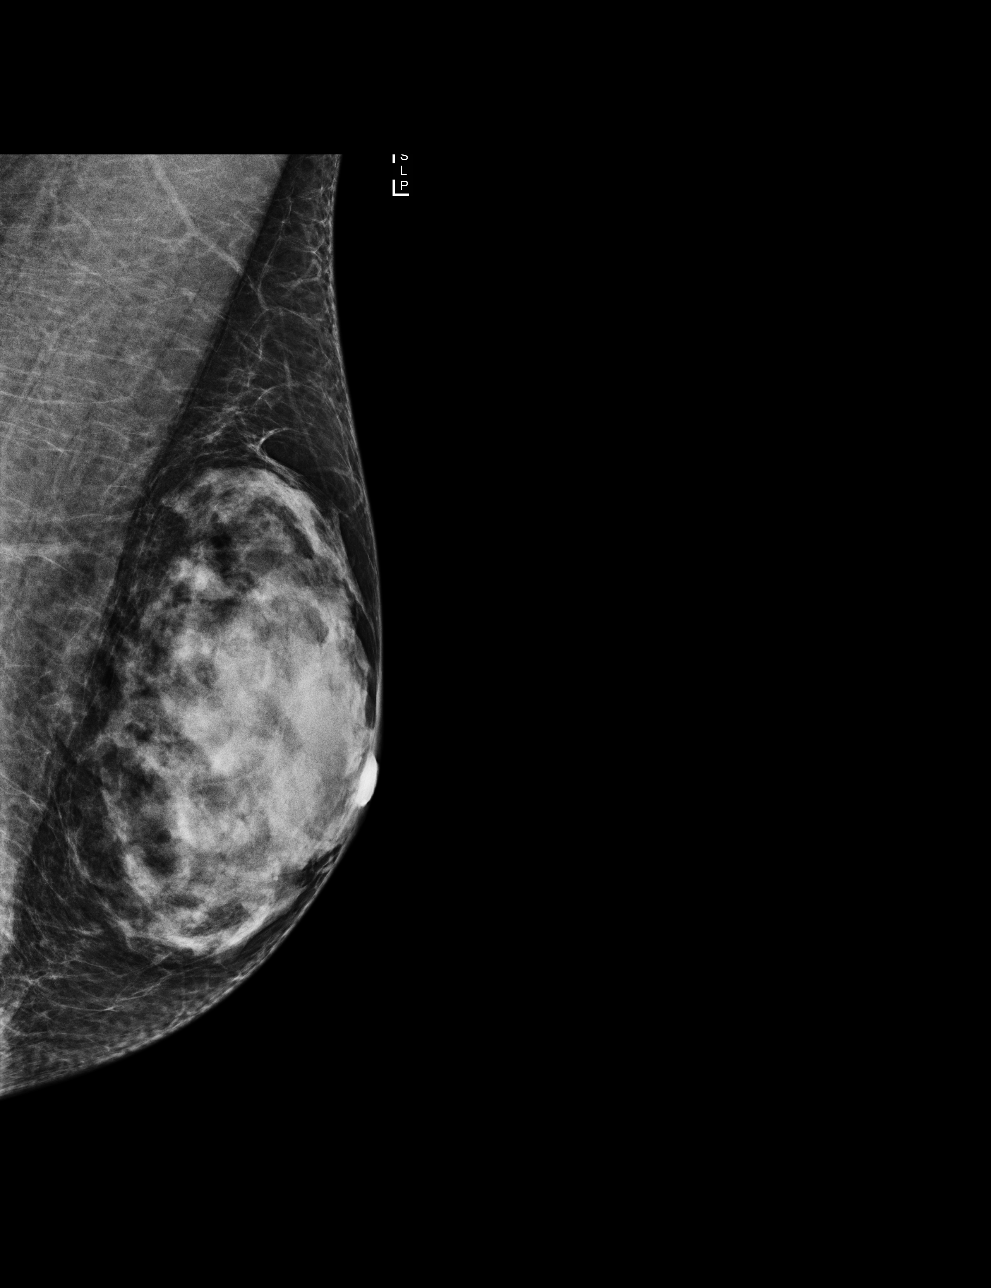

[L CC]
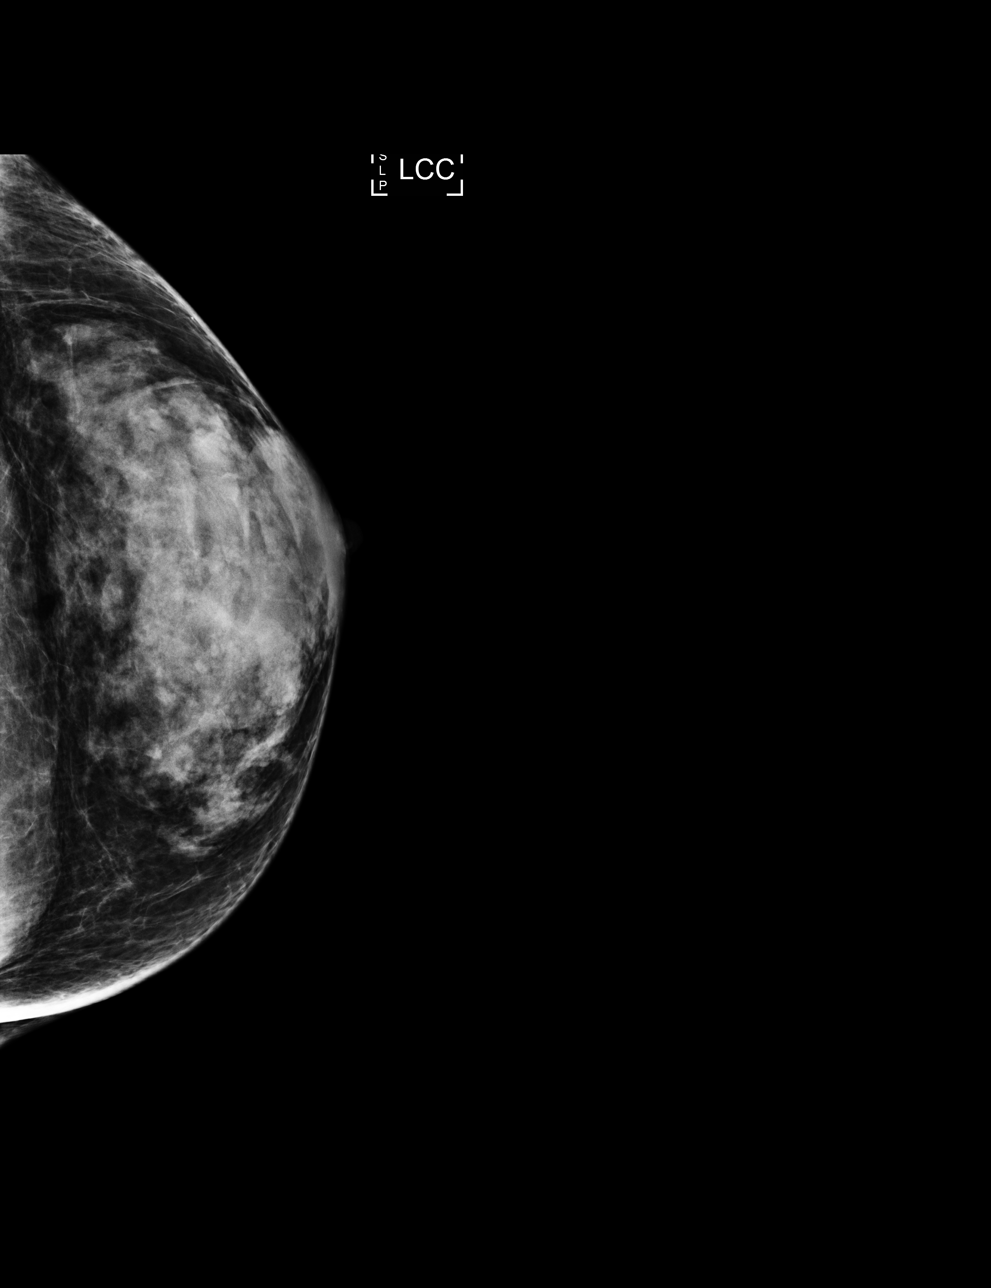

[R MLO]
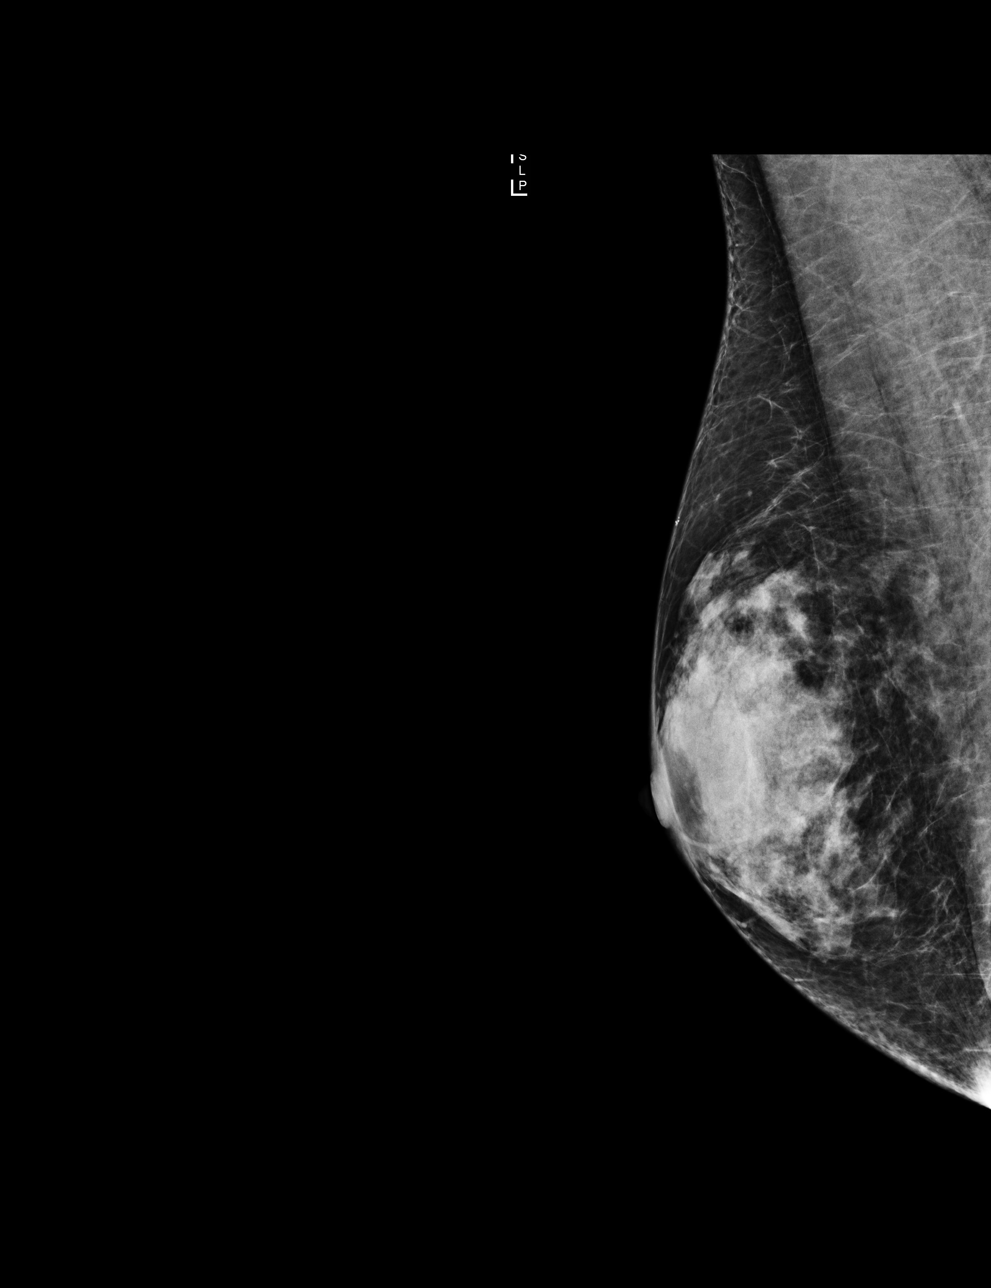

[R CC]
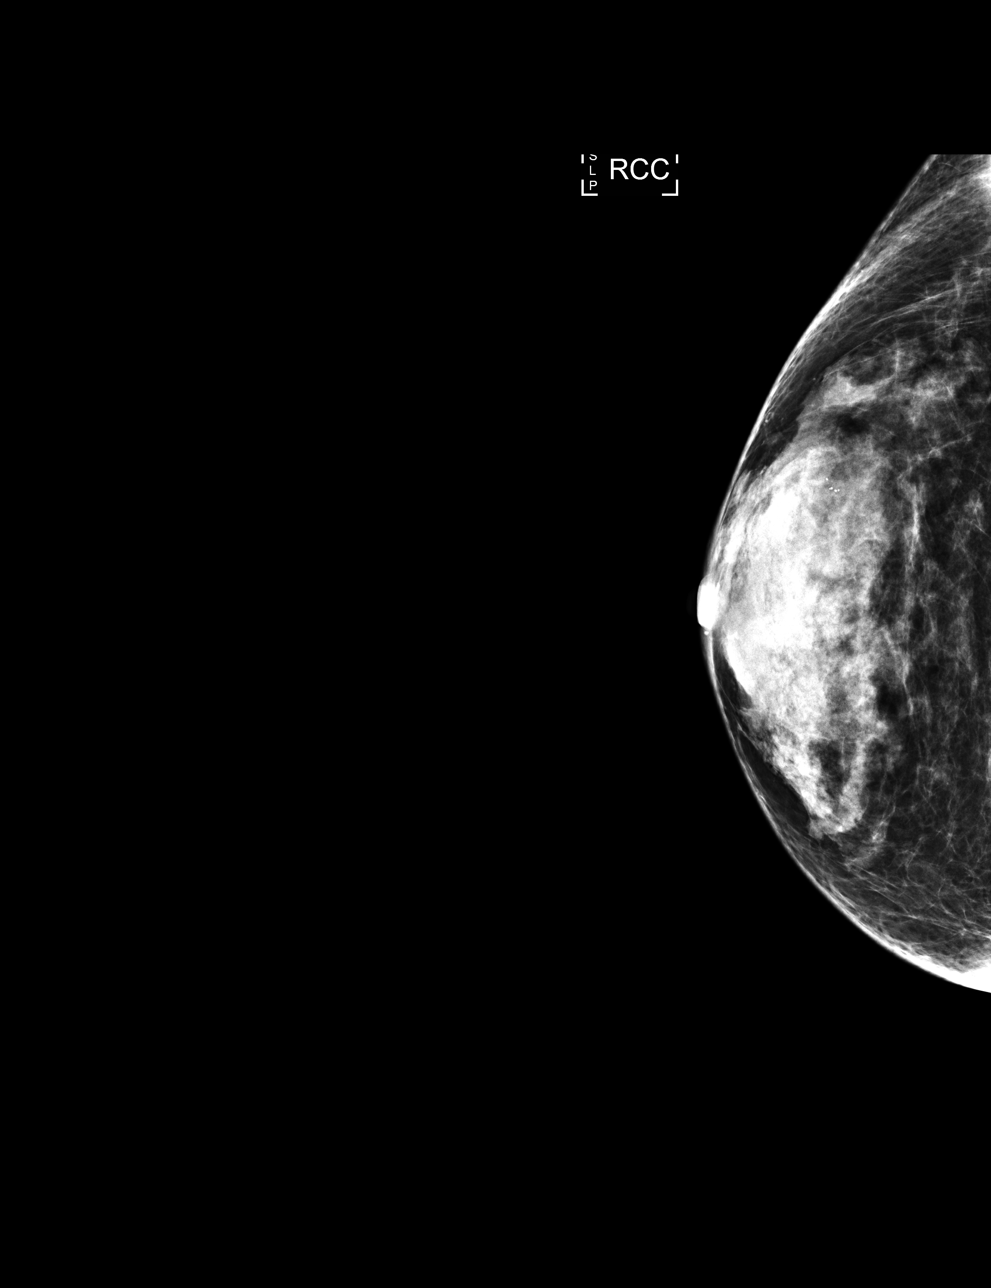

[4 of 4 positions shown; findings below may reference images not displayed]

ACR Breast Density Category d: The breast tissue is extremely dense,
which lowers the sensitivity of mammography.
FINDINGS: There are no findings suspicious for malignancy. Images were
processed with CAD.
IMPRESSION: No mammographic evidence of malignancy. A result letter of this
screening mammogram will be mailed directly to the patient.

RECOMMENDATION:
Screening mammogram in one year. (Code:8A-L-C49)

BI-RADS CATEGORY  1: Negative.

## 2021-01-29 ENCOUNTER — Ambulatory Visit: Payer: BC Managed Care – PPO | Admitting: Dermatology

## 2021-02-06 ENCOUNTER — Other Ambulatory Visit: Payer: Self-pay | Admitting: Family Medicine

## 2021-02-06 ENCOUNTER — Ambulatory Visit: Payer: BC Managed Care – PPO | Admitting: Dermatology

## 2021-02-06 ENCOUNTER — Other Ambulatory Visit: Payer: Self-pay

## 2021-02-06 DIAGNOSIS — D2272 Melanocytic nevi of left lower limb, including hip: Secondary | ICD-10-CM

## 2021-02-06 DIAGNOSIS — D2361 Other benign neoplasm of skin of right upper limb, including shoulder: Secondary | ICD-10-CM

## 2021-02-06 DIAGNOSIS — L578 Other skin changes due to chronic exposure to nonionizing radiation: Secondary | ICD-10-CM | POA: Diagnosis not present

## 2021-02-06 DIAGNOSIS — L820 Inflamed seborrheic keratosis: Secondary | ICD-10-CM

## 2021-02-06 DIAGNOSIS — Z1283 Encounter for screening for malignant neoplasm of skin: Secondary | ICD-10-CM | POA: Diagnosis not present

## 2021-02-06 DIAGNOSIS — L814 Other melanin hyperpigmentation: Secondary | ICD-10-CM

## 2021-02-06 DIAGNOSIS — D229 Melanocytic nevi, unspecified: Secondary | ICD-10-CM

## 2021-02-06 DIAGNOSIS — Z86018 Personal history of other benign neoplasm: Secondary | ICD-10-CM

## 2021-02-06 DIAGNOSIS — Z1231 Encounter for screening mammogram for malignant neoplasm of breast: Secondary | ICD-10-CM

## 2021-02-06 DIAGNOSIS — D1800 Hemangioma unspecified site: Secondary | ICD-10-CM

## 2021-02-06 DIAGNOSIS — D485 Neoplasm of uncertain behavior of skin: Secondary | ICD-10-CM

## 2021-02-06 DIAGNOSIS — L821 Other seborrheic keratosis: Secondary | ICD-10-CM

## 2021-02-06 NOTE — Progress Notes (Signed)
Follow-Up Visit   Subjective  Stacey Oconnell is a 60 y.o. adult who presents for the following: Annual Exam (Patient here for full body skin exam and skin cancer screening. Patient with hx of dysplastic nevus. She does have a spot at left neck area that has been crusty, gets irritated by bra strap. ).  Patient is having 2 spots under eyes removed tomorrow by Psychiatric nurse.   The following portions of the chart were reviewed this encounter and updated as appropriate:       Review of Systems:  No other skin or systemic complaints except as noted in HPI or Assessment and Plan.  Objective  Well appearing patient in no apparent distress; mood and affect are within normal limits.  A full examination was performed including scalp, head, eyes, ears, nose, lips, neck, chest, axillae, abdomen, back, buttocks, bilateral upper extremities, bilateral lower extremities, hands, feet, fingers, toes, fingernails, and toenails. All findings within normal limits unless otherwise noted below.  left medial shoulder Erythematous stuck-on, waxy papule  4th left toe at webspace 4 mm medium light brown macule  Left lateral canthus inferior, right infraocular 4 mm speckled brown flat papule at left lateral canthus inferior       Assessment & Plan  Inflamed seborrheic keratosis left medial shoulder  Destruction of lesion - left medial shoulder  Destruction method: cryotherapy   Informed consent: discussed and consent obtained   Lesion destroyed using liquid nitrogen: Yes   Region frozen until ice ball extended beyond lesion: Yes   Outcome: patient tolerated procedure well with no complications   Post-procedure details: wound care instructions given   Additional details:  Prior to procedure, discussed risks of blister formation, small wound, skin dyspigmentation, or rare scar following cryotherapy. Recommend Vaseline ointment to treated areas while healing.   Nevus 4th left toe at  webspace  Benign-appearing.  Observation.  Call clinic for new or changing lesions.  Recommend daily use of broad spectrum spf 30+ sunscreen to sun-exposed areas.    Neoplasm of uncertain behavior of skin Left lateral canthus inferior, right infraocular  Possible ISK/SK vrs other  Patient having biopsy done tomorrow by plastic surgeon.    Lentigines - Scattered tan macules - Due to sun exposure - Benign-appearing, observe - Recommend daily broad spectrum sunscreen SPF 30+ to sun-exposed areas, reapply every 2 hours as needed. - Call for any changes  Seborrheic Keratoses - Stuck-on, waxy, tan-brown papules and/or plaques  - Benign-appearing - Discussed benign etiology and prognosis. - Observe - Call for any changes  Melanocytic Nevi - Tan-brown and/or pink-flesh-colored symmetric macules and papules, multiple small medium dark brown macules at back - Benign appearing on exam today - Observation - Call clinic for new or changing moles - Recommend daily use of broad spectrum spf 30+ sunscreen to sun-exposed areas.   Hemangiomas - Red papules - Discussed benign nature - Observe - Call for any changes  Dermatofibroma - Firm pink/brown papulenodule with dimple sign at right posterior shoulder - Benign appearing - Call for any changes  Actinic Damage - Chronic condition, secondary to cumulative UV/sun exposure - diffuse scaly erythematous macules with underlying dyspigmentation - Recommend daily broad spectrum sunscreen SPF 30+ to sun-exposed areas, reapply every 2 hours as needed.  - Staying in the shade or wearing long sleeves, sun glasses (UVA+UVB protection) and wide brim hats (4-inch brim around the entire circumference of the hat) are also recommended for sun protection.  - Call for new or changing  lesions.  Skin cancer screening performed today.  History of Dysplastic Nevi - No evidence of recurrence today - Recommend regular full body skin exams - Recommend  daily broad spectrum sunscreen SPF 30+ to sun-exposed areas, reapply every 2 hours as needed.  - Call if any new or changing lesions are noted between office visits  Return in about 1 year (around 02/06/2022) for TBSE.  Graciella Belton, RMA, am acting as scribe for Brendolyn Patty, MD .  Documentation: I have reviewed the above documentation for accuracy and completeness, and I agree with the above.  Brendolyn Patty MD

## 2021-02-06 NOTE — Patient Instructions (Addendum)
Cryotherapy Aftercare  Wash gently with soap and water everyday.   Apply Vaseline and Band-Aid daily until healed.   Melanoma ABCDEs  Melanoma is the most dangerous type of skin cancer, and is the leading cause of death from skin disease.  You are more likely to develop melanoma if you: Have light-colored skin, light-colored eyes, or red or blond hair Spend a lot of time in the sun Tan regularly, either outdoors or in a tanning bed Have had blistering sunburns, especially during childhood Have a close family member who has had a melanoma Have atypical moles or large birthmarks  Early detection of melanoma is key since treatment is typically straightforward and cure rates are extremely high if we catch it early.   The first sign of melanoma is often a change in a mole or a new dark spot.  The ABCDE system is a way of remembering the signs of melanoma.  A for asymmetry:  The two halves do not match. B for border:  The edges of the growth are irregular. C for color:  A mixture of colors are present instead of an even brown color. D for diameter:  Melanomas are usually (but not always) greater than 22mm - the size of a pencil eraser. E for evolution:  The spot keeps changing in size, shape, and color.  Please check your skin once per month between visits. You can use a small mirror in front and a large mirror behind you to keep an eye on the back side or your body.   If you see any new or changing lesions before your next follow-up, please call to schedule a visit.  Please continue daily skin protection including broad spectrum sunscreen SPF 30+ to sun-exposed areas, reapplying every 2 hours as needed when you're outdoors.    If You Need Anything After Your Visit  If you have any questions or concerns for your doctor, please call our main line at [phone] and press option 4 to reach your doctor's medical assistant. If no one answers, please leave a voicemail as directed and we will  return your call as soon as possible. Messages left after 4 pm will be answered the following business day.   You may also send [place] a message via [place]. We typically respond to MyChart messages within 1-2 business days.  For prescription refills, please ask your pharmacy to contact our office. Our fax number is [phone].  If you have an urgent issue when the clinic is closed that cannot wait until the next business day, you can page your doctor at the number below.    Please note that while we do our best to be available for urgent issues outside of office hours, we are not available 24/7.   If you have an urgent issue and are unable to reach [place], you may choose to seek medical care at your doctor's office, retail clinic, urgent care center, or emergency room.  If you have a medical emergency, please immediately call 911 or go to the emergency department.  Pager Numbers  - Dr. Nehemiah Massed: [phone]  - Dr. Laurence Ferrari: [phone]  - Dr. Nicole Kindred: [phone]  In the event of inclement weather, please call our main line at [phone] for an update on the status of any delays or closures.  Dermatology Medication Tips: Please keep the boxes that topical medications come in in order to help keep track of the instructions about where and how to use these. Pharmacies typically print the medication instructions  only on the boxes and not directly on the medication tubes.   If your medication is too expensive, please contact our office at [phone] option 4 or send [place] a message through [place].   We are unable to tell what your co-pay for medications will be in advance as this is different depending on your insurance coverage. However, we may be able to find a substitute medication at lower cost or fill out paperwork to get insurance to cover a needed medication.   If a prior authorization is required to get your medication covered by your insurance company, please allow [place] 1-2 business  days to complete this process.  Drug prices often vary depending on where the prescription is filled and some pharmacies may offer cheaper prices.  The website www.goodrx.com contains coupons for medications through different pharmacies. The prices here do not account for what the cost may be with help from insurance (it may be cheaper with your insurance), but the website can give you the price if you did not use any insurance.  - You can print the associated coupon and take it with your prescription to the pharmacy.  - You may also stop by our office during regular business hours and pick up a GoodRx coupon card.  - If you need your prescription sent electronically to a different pharmacy, notify our office through [facility] or by phone at [phone] option 4.     Si Usted Necesita Algo Despus de Su Visita  Tambin puede enviarnos un mensaje a travs de Pharmacist, community. Por lo general respondemos a los mensajes de MyChart en el transcurso de 1 a 2 das hbiles.  Para renovar recetas, por favor pida a su farmacia que se ponga en contacto con nuestra oficina. Harland Dingwall de fax es [place] [phone].  Si tiene un asunto urgente cuando la clnica est cerrada y que no puede esperar hasta el siguiente da hbil, puede llamar/localizar a su doctor(a) al nmero que aparece a continuacin.   Por favor, tenga en cuenta que aunque hacemos todo lo posible para estar disponibles para asuntos urgentes fuera del horario de [place], no estamos disponibles las 24 horas del da, los 7 das de la [place].   Si tiene un problema urgente y no puede comunicarse con nosotros, puede optar por buscar atencin mdica  en el consultorio de su doctor(a), en una clnica privada, en un centro de atencin urgente o en una sala de emergencias.  Si tiene Engineering geologist, por favor llame inmediatamente al 911 o vaya a la sala de emergencias.  Nmeros de bper  - Dr. Nehemiah Massed: [phone]  - Dra. Moye:  [phone]  - Dra. Nicole Kindred: [phone]  En caso de inclemencias del [place], por favor llame a Johnsie Kindred principal al [phone] para una actualizacin sobre el [place] de cualquier retraso o cierre.  Consejos para la medicacin en dermatologa: Por favor, guarde las cajas en las que vienen los medicamentos de uso tpico para ayudarle a seguir las instrucciones sobre dnde y cmo usarlos. Las farmacias generalmente imprimen las instrucciones del medicamento slo en las cajas y no directamente en los tubos del [place].   Si su medicamento es muy caro, por favor, pngase en contacto con Zigmund Daniel llamando al [phone] y presione la opcin 4 o envenos un mensaje a travs de Pharmacist, community.   No podemos decirle cul ser su copago por los medicamentos por adelantado ya que esto es diferente dependiendo de la cobertura de su seguro. Sin embargo, es  posible que podamos encontrar un medicamento sustituto a Electrical engineer un formulario para que el seguro cubra el medicamento que se considera necesario.   Si se requiere una autorizacin previa para que su compaa de seguros [place] su medicamento, por favor permtanos de 1 a 2 das hbiles para completar este proceso.  Los precios de los medicamentos varan con frecuencia dependiendo del Environmental consultant de dnde se surte la receta y alguna farmacias pueden ofrecer precios ms baratos.  El sitio web www.goodrx.com tiene cupones para medicamentos de Airline pilot. Los precios aqu no tienen en cuenta lo que podra costar con la ayuda del seguro (puede ser ms barato con su seguro), pero el sitio web puede darle el precio si no utiliz Research scientist (physical sciences).  - Puede imprimir el cupn correspondiente y llevarlo con su receta a la farmacia.  - Tambin puede pasar por nuestra oficina durante el horario de atencin regular y Charity fundraiser una tarjeta de cupones de GoodRx.  - Si necesita que su receta se enve electrnicamente a una farmacia diferente,  informe a nuestra oficina a travs de MyChart de [place] o por telfono llamando al [phone] y presione la opcin 4.

## 2021-03-15 ENCOUNTER — Encounter: Payer: Self-pay | Admitting: Gastroenterology

## 2021-03-15 ENCOUNTER — Other Ambulatory Visit: Payer: Self-pay

## 2021-03-15 ENCOUNTER — Ambulatory Visit: Payer: BC Managed Care – PPO | Admitting: Gastroenterology

## 2021-03-15 VITALS — BP 136/78 | HR 100 | Ht 73.0 in | Wt 171.5 lb

## 2021-03-15 DIAGNOSIS — Z1211 Encounter for screening for malignant neoplasm of colon: Secondary | ICD-10-CM | POA: Diagnosis not present

## 2021-03-15 DIAGNOSIS — Z1212 Encounter for screening for malignant neoplasm of rectum: Secondary | ICD-10-CM | POA: Diagnosis not present

## 2021-03-15 DIAGNOSIS — K5730 Diverticulosis of large intestine without perforation or abscess without bleeding: Secondary | ICD-10-CM

## 2021-03-15 DIAGNOSIS — Z86010 Personal history of colonic polyps: Secondary | ICD-10-CM | POA: Diagnosis not present

## 2021-03-15 NOTE — Progress Notes (Signed)
Chief Complaint: Polyp surveillance  Referring Provider:     Shawnee Knapp, MD   HPI:     Stacey Oconnell is a 61 y.o. adult with a history of hyperlipidemia referred to the Gastroenterology Clinic for evaluation ongoing polyp surveillance.    FHx n/f father with colon polyps requiring surgical resection (patient thinks aggressive histology polyp, but not CRC) and ?SBO x2   Endoscopic History: - Colonoscopy (03/2012, Dr. Candace Cruise): Normal.  Recommended repeat in 5 years for surveillance due to family history - Colonoscopy (02/2018, Dr. Bryan Lemma): 3 small polyps (path: Tubular adenomas), 2 mm rectal polyp (path: HP), Diverticulosis, hypertrophied anal papilla.  Normal TI.  Repeat in 3 years  No new labs or abdominal imaging for review.  Otherwise no active GI symptoms.  Past Medical History:  Diagnosis Date   Allergy    seasonal   Dysplastic nevus 05/16/2015   Spinal upper back. Severe atypia, close to margin. Excised 06/05/2015, margins free.   Elevated prostate specific antigen (PSA) 2014   Pt report oncologist at [facility] suspected irritation from avid bike riding, nml since   Enlarged prostate    Hx of dysplastic nevus 2007   multiple sites   Hyperlipidemia    managed with Mediterranean diet and exercise, +FHx in father   Syncope      Past Surgical History:  Procedure Laterality Date   COLONOSCOPY  05/2012   Dr.Oh at [facility]/normal exam    Family History  Problem Relation Age of Onset   Hyperlipidemia Father    Heart disease Father    Hypertension Father    Colon polyps Father    Diabetes Mother    Breast cancer Paternal Aunt    Colon cancer Neg Hx    Esophageal cancer Neg Hx    Rectal cancer Neg Hx    Stomach cancer Neg Hx    Social History   Tobacco Use   Smoking status: Never   Smokeless tobacco: Never  Vaping Use   Vaping Use: Never used  Substance Use Topics   Alcohol use: Yes    Alcohol/week: 1.0 standard drink    Types: 1 Standard drinks or  equivalent per week    Comment: Social   Drug use: No   Current Outpatient Medications  Medication Sig Dispense Refill   estradiol (ESTRACE) 2 MG tablet Take 1 tablet (2 mg total) by mouth 3 (three) times daily. 270 tablet 3   fluticasone (FLONASE) 50 MCG/ACT nasal spray Place into both nostrils.     spironolactone (ALDACTONE) 100 MG tablet Take 1 tablet (100 mg total) by mouth 2 (two) times daily. 180 tablet 3   terbinafine (LAMISIL) 250 MG tablet Take 250 mg by mouth daily.     VITAMIN D PO Take 2,000 Units by mouth daily.     No current facility-administered medications for this visit.   No Known Allergies   Review of Systems: All systems reviewed and negative except where noted in HPI.     Physical Exam:    Wt Readings from Last 3 Encounters:  03/15/21 171 lb 8 oz (77.8 kg)  03/13/18 160 lb (72.6 kg)  03/05/18 160 lb 8 oz (72.8 kg)    BP 136/78    Pulse 100    Ht 6\' 1"  (1.854 m)    Wt 171 lb 8 oz (77.8 kg)    SpO2 98%    BMI 22.63 kg/m  Constitutional:  Pleasant, in no acute distress. Psychiatric: Normal mood and affect. Behavior is normal. Neurological: Alert and oriented to person place and time.    ASSESSMENT AND PLAN;   1) History of colon polyps 2) Diverticulosis - Due for repeat colonoscopy for ongoing polyp surveillance - Schedule colonoscopy  The indications, risks, and benefits of colonoscopy were explained to the patient in detail. Risks include but are not limited to bleeding, perforation, adverse reaction to medications, and cardiopulmonary compromise. Sequelae include but are not limited to the possibility of surgery, hospitalization, and mortality. The patient verbalized understanding and wished to proceed. All questions answered, referred to the scheduler and bowel prep ordered. Further recommendations pending results of the exam.     Lavena Bullion, DO, FACG  03/15/2021, 2:39 PM   Brigitte Pulse Laurey Arrow, MD

## 2021-03-15 NOTE — Patient Instructions (Addendum)
If you are age 61 or older, your body mass index should be between 23-30. Your Body mass index is 22.63 kg/m. If this is out of the aforementioned range listed, please consider follow up with your Primary Care Provider.  If you are age 80 or younger, your body mass index should be between 19-25. Your Body mass index is 22.63 kg/m. If this is out of the aformentioned range listed, please consider follow up with your Primary Care Provider.   __________________________________________________________  The  GI providers would like to encourage you to use [facility] to communicate with providers for non-urgent requests or questions.  Due to long hold times on the telephone, sending your provider a message by [facility] may be a faster and more efficient way to get a response.  Please allow 48 business hours for a response.  Please remember that this is for non-urgent requests.   We have given you a sample of Clenpiq for your colonoscopy.  Due to recent changes in healthcare laws, you may see the results of your imaging and laboratory studies on MyChart before your provider has had a chance to review them.  We understand that in some cases there may be results that are confusing or concerning to you. Not all laboratory results come back in the same time frame and the provider may be waiting for multiple results in order to interpret others.  Please give [place] 48 hours in order for your provider to thoroughly review all the results before contacting the office for clarification of your results.   Thank you for choosing me and [facility].  Vito Cirigliano, D.O.

## 2021-03-19 ENCOUNTER — Ambulatory Visit
Admission: RE | Admit: 2021-03-19 | Discharge: 2021-03-19 | Disposition: A | Discharge status: Home / Self Care | Payer: BC Managed Care – PPO | Source: Ambulatory Visit | Attending: Family Medicine | Admitting: Family Medicine

## 2021-03-19 ENCOUNTER — Other Ambulatory Visit: Payer: Self-pay

## 2021-03-19 DIAGNOSIS — Z1231 Encounter for screening mammogram for malignant neoplasm of breast: Secondary | ICD-10-CM

## 2021-04-11 ENCOUNTER — Encounter: Payer: Self-pay | Admitting: Gastroenterology

## 2021-04-24 ENCOUNTER — Encounter: Payer: Self-pay | Admitting: Certified Registered Nurse Anesthetist

## 2021-04-25 ENCOUNTER — Other Ambulatory Visit: Payer: Self-pay

## 2021-04-25 ENCOUNTER — Telehealth: Payer: Self-pay

## 2021-04-25 ENCOUNTER — Ambulatory Visit: Payer: BC Managed Care – PPO | Admitting: Gastroenterology

## 2021-04-25 ENCOUNTER — Encounter: Payer: Self-pay | Admitting: Gastroenterology

## 2021-04-25 VITALS — BP 120/69 | HR 84 | Temp 98.0°F | Resp 12 | Ht 73.0 in | Wt 171.0 lb

## 2021-04-25 DIAGNOSIS — Z86010 Personal history of colonic polyps: Secondary | ICD-10-CM

## 2021-04-25 DIAGNOSIS — I447 Left bundle-branch block, unspecified: Secondary | ICD-10-CM

## 2021-04-25 MED ORDER — SODIUM CHLORIDE 0.9 % IV SOLN: 500.0000 mL | Freq: Once | INTRAVENOUS | Status: DC

## 2021-04-25 NOTE — Progress Notes (Signed)
Patient was transported from the preoperative area to the endoscopy suite without issue.  Shortly after starting propofol was noted to have a change in rhythm strip.  Colonoscopy was never started.  ? ?Twelve-lead EKG was obtained and notable for new LBBB with QRS 159m.  Otherwise NSR at 80 bpm.  Reviewed comparison EKG from 07/2017 which was normal.  ? ?Patient without CP or anginal equivalents prior to the procedure.  Colonoscopy was never started, patient stated transported to recovery area and woke up from sedation without issue.  Will place referral to Cardiology for evaluation prior to scheduling repeat colonoscopy. ? ?EKG findings and recommendations discussed with patient in recovery area.  ? ?Okie Bogacz, DO, FACG ?[facility] ? ?

## 2021-04-25 NOTE — Patient Instructions (Signed)
Patient has been referred to Cardiology. ?

## 2021-04-25 NOTE — Progress Notes (Signed)
Pt's states no medical or surgical changes since previsit or office visit.  VS CW  

## 2021-04-25 NOTE — Telephone Encounter (Signed)
-----   Message from [place], DO sent at 04/25/2021  9:30 AM EDT ----- ?Can you please place a referral to Cardiology for this patient. New LBBB noted prior to starting Colonoscopy. Colo was cancelled. Patient o/w w/o Cards sxs but needs Cards eval.  ? ?Thanks. ? ?

## 2021-04-25 NOTE — Progress Notes (Signed)
Report given to PACU, vss 

## 2021-04-25 NOTE — Telephone Encounter (Signed)
Ambulatory referral to cardiology placed. ?

## 2021-04-25 NOTE — Progress Notes (Signed)
? ?GASTROENTEROLOGY PROCEDURE H&P NOTE  ? ?Primary Care Physician: ?Shawnee Knapp, MD ? ? ? ?Reason for Procedure:  History of colon polyps, polyp surveillance ? ?Plan:    Colonoscopy ? ?Patient is appropriate for endoscopic procedure(s) in the ambulatory ([facility]) setting. ? ?The nature of the procedure, as well as the risks, benefits, and alternatives were carefully and thoroughly reviewed with the patient. Ample time for discussion and questions allowed. The patient understood, was satisfied, and agreed to proceed.  ? ? ? ?HPI: ?Stacey Oconnell is a 61 y.o. adult who presents for colonoscopy for ongoing polyp surveillance. ? ?FHx n/f father with colon polyps requiring surgical resection (patient thinks aggressive histology polyp, but not CRC).  ? ?Endoscopic History: ?- Colonoscopy (03/2012, Dr. Candace Cruise): Normal.  Recommended repeat in 5 years for surveillance due to family history ?- Colonoscopy (02/2018, Dr. Bryan Lemma): 3 small polyps (path: Tubular adenomas), 2 mm rectal polyp (path: HP), Diverticulosis, hypertrophied anal papilla.  Normal TI.  Repeat in 3 years ? ?Past Medical History:  ?Diagnosis Date  ? Allergy   ? seasonal  ? Dysplastic nevus 05/16/2015  ? Spinal upper back. Severe atypia, close to margin. Excised 06/05/2015, margins free.  ? Elevated prostate specific antigen (PSA) 2014  ? Pt report oncologist at [facility] suspected irritation from avid bike riding, nml since  ? Enlarged prostate   ? Hx of dysplastic nevus 2007  ? multiple sites  ? Hyperlipidemia   ? managed with Mediterranean diet and exercise, +FHx in father  ? Syncope   ? ? ?Past Surgical History:  ?Procedure Laterality Date  ? COLONOSCOPY  05/2012  ? Dr.Oh at [facility]/normal exam   ? COLONOSCOPY W/ POLYPECTOMY  03/13/2018  ? polyps  ? COLONOSCOPY WITH PROPOFOL  04/25/2021  ? ? ?Prior to Admission medications   ?Medication Sig Start Date End Date Taking? Authorizing Provider  ?estradiol (ESTRACE) 2 MG tablet Take 1 tablet (2 mg total) by mouth 3  (three) times daily. 01/20/18  Yes Shawnee Knapp, MD  ?fluticasone Asencion Islam) 50 MCG/ACT nasal spray Place into both nostrils. 01/10/20  Yes [provider]  ?spironolactone (ALDACTONE) 100 MG tablet Take 1 tablet (100 mg total) by mouth 2 (two) times daily. 01/20/18  Yes Shawnee Knapp, MD  ?terbinafine (LAMISIL) 250 MG tablet Take 250 mg by mouth daily. 12/21/20  Yes [provider]  ?VITAMIN D PO Take 2,000 Units by mouth daily.   Yes [provider]  ? ? ?Current Outpatient Medications  ?Medication Sig Dispense Refill  ? estradiol (ESTRACE) 2 MG tablet Take 1 tablet (2 mg total) by mouth 3 (three) times daily. 270 tablet 3  ? fluticasone (FLONASE) 50 MCG/ACT nasal spray Place into both nostrils.    ? spironolactone (ALDACTONE) 100 MG tablet Take 1 tablet (100 mg total) by mouth 2 (two) times daily. 180 tablet 3  ? terbinafine (LAMISIL) 250 MG tablet Take 250 mg by mouth daily.    ? VITAMIN D PO Take 2,000 Units by mouth daily.    ? ?Current Facility-Administered Medications  ?Medication Dose Route Frequency Provider Last Rate Last Admin  ? 0.9 %  sodium chloride infusion  500 mL Intravenous Once Xavious Sharrar V, DO      ? ? ?Allergies as of 04/25/2021  ? (No Known Allergies)  ? ? ?Family History  ?Problem Relation Age of Onset  ? Hyperlipidemia Father   ? Heart disease Father   ? Hypertension Father   ? Colon polyps Father   ?  Diabetes Mother   ? Breast cancer Paternal Aunt   ? Colon cancer Neg Hx   ? Esophageal cancer Neg Hx   ? Rectal cancer Neg Hx   ? Stomach cancer Neg Hx   ? ? ?Social History  ? ?Socioeconomic History  ? Marital status: Divorced  ?  Spouse name: Not on file  ? Number of children: 0  ? Years of education: Not on file  ? Highest education level: Not on file  ?Occupational History  ? Not on file  ?Tobacco Use  ? Smoking status: Never  ? Smokeless tobacco: Never  ?Vaping Use  ? Vaping Use: Never used  ?Substance and Sexual Activity  ? Alcohol use: Not Currently  ?   Alcohol/week: 1.0 standard drink  ?  Types: 1 Standard drinks or equivalent per week  ?  Comment: Social  ? Drug use: Never  ? Sexual activity: Not on file  ?  Comment: transgender, legal sex- female  ?Other Topics Concern  ? Not on file  ?Social History Narrative  ? Not on file  ? ?Social Determinants of Health  ? ?Financial Resource Strain: Not on file  ?Food Insecurity: Not on file  ?Transportation Needs: Not on file  ?Physical Activity: Not on file  ?Stress: Not on file  ?Social Connections: Not on file  ?Intimate Partner Violence: Not on file  ? ? ?Physical Exam: ?Vital signs in last 24 hours: ?'@BP'$  122/74   Pulse (!) 104   Temp 98 ?F (36.7 ?C) (Temporal)   Ht '6\' 1"'$  (1.854 m)   Wt 171 lb (77.6 kg)   SpO2 99%   BMI 22.56 kg/m?  ?GEN: NAD ?EYE: Sclerae anicteric ?ENT: MMM ?CV: Non-tachycardic ?Pulm: CTA b/l ?GI: Soft, NT/ND ?NEURO:  Alert & Oriented x 3 ? ? ?Gerrit Heck, DO ?[facility] ? ? ?04/25/2021 8:59 AM ? ?

## 2021-04-25 NOTE — Progress Notes (Signed)
EkG changes noted with propofol given, 12 lead obtained with new BBB noted. Vss Dr Marolyn Hammock at bedside and case canceled. ?

## 2021-04-26 NOTE — Progress Notes (Signed)
? ?Chief Complaint  ?Patient presents with  ? New Patient (Initial Visit)  ?  LBBB  ?  ?History of Present Illness: 61 yo female (currently transitioning) with history of hyperlipidemia here today as a new consult for the evaluation of left bundle branch block noted on EKG on 04/25/21 prior to planned colonoscopy. She is on estrogen therapy as part of her transition but has not yet had gender reassignment surgery. She is not known to have any heart disease. She has no chest pain, dyspnea, dizziness, palpitations, near syncope or syncope.  ? ?Primary Care Physician: Shawnee Knapp, MD ? ?Past Medical History:  ?Diagnosis Date  ? Allergy   ? seasonal  ? Dysplastic nevus 05/16/2015  ? Spinal upper back. Severe atypia, close to margin. Excised 06/05/2015, margins free.  ? Elevated prostate specific antigen (PSA) 2014  ? Pt report oncologist at [facility] suspected irritation from avid bike riding, nml since  ? Enlarged prostate   ? Hx of dysplastic nevus 2007  ? multiple sites  ? Hyperlipidemia   ? managed with Mediterranean diet and exercise, +FHx in father  ? Syncope   ? ? ?Past Surgical History:  ?Procedure Laterality Date  ? COLONOSCOPY  05/2012  ? Dr.Oh at [facility]/normal exam   ? COLONOSCOPY W/ POLYPECTOMY  03/13/2018  ? polyps  ? COLONOSCOPY WITH PROPOFOL  04/25/2021  ? ? ?Current Outpatient Medications  ?Medication Sig Dispense Refill  ? estradiol (ESTRACE) 2 MG tablet Take 1 tablet (2 mg total) by mouth 3 (three) times daily. 270 tablet 3  ? fluticasone (FLONASE) 50 MCG/ACT nasal spray Place into both nostrils.    ? spironolactone (ALDACTONE) 100 MG tablet Take 1 tablet (100 mg total) by mouth 2 (two) times daily. 180 tablet 3  ? terbinafine (LAMISIL) 250 MG tablet Take 250 mg by mouth daily.    ? VITAMIN D PO Take 2,000 Units by mouth daily.    ? ?No current facility-administered medications for this visit.  ? ? ?No Known Allergies ? ?Social History  ? ?Socioeconomic History  ? Marital status: Divorced  ?  Spouse name: Not  on file  ? Number of children: 0  ? Years of education: Not on file  ? Highest education level: Not on file  ?Occupational History  ? Occupation: Works in Radio producer  ?Tobacco Use  ? Smoking status: Never  ? Smokeless tobacco: Never  ?Vaping Use  ? Vaping Use: Never used  ?Substance and Sexual Activity  ? Alcohol use: Not Currently  ?  Alcohol/week: 1.0 standard drink  ?  Types: 1 Standard drinks or equivalent per week  ?  Comment: Social  ? Drug use: Never  ? Sexual activity: Not on file  ?  Comment: transgender, legal sex- female  ?Other Topics Concern  ? Not on file  ?Social History Narrative  ? Not on file  ? ?Social Determinants of Health  ? ?Financial Resource Strain: Not on file  ?Food Insecurity: Not on file  ?Transportation Needs: Not on file  ?Physical Activity: Not on file  ?Stress: Not on file  ?Social Connections: Not on file  ?Intimate Partner Violence: Not on file  ? ? ?Family History  ?Problem Relation Age of Onset  ? Diabetes Mother   ? Hyperlipidemia Father   ? Hypertension Father   ? Colon polyps Father   ? Breast cancer Paternal Aunt   ? Colon cancer Neg Hx   ? Esophageal cancer Neg Hx   ? Rectal cancer Neg  Hx   ? Stomach cancer Neg Hx   ? ? ?Review of Systems:  As stated in the HPI and otherwise negative.  ? ?BP 134/66   Pulse 99   Ht '6\' 1"'$  (1.854 m)   Wt 172 lb (78 kg)   SpO2 99%   BMI 22.69 kg/m?  ? ?Physical Examination: ?General: Well developed, well nourished, NAD  ?HEENT: OP clear, mucus membranes moist  ?SKIN: warm, dry. No rashes. ?Neuro: No focal deficits  ?Musculoskeletal: Muscle strength 5/5 all ext  ?Psychiatric: Mood and affect normal  ?Neck: No JVD, no carotid bruits, no thyromegaly, no lymphadenopathy.  ?Lungs:Clear bilaterally, no wheezes, rhonci, crackles ?Cardiovascular: Regular rate and rhythm. No murmurs, gallops or rubs. ?Abdomen:Soft. Bowel sounds present. Non-tender.  ?Extremities: No lower extremity edema. Pulses are 2 + in the bilateral DP/PT. ? ?EKG:  EKG is ordered  today. ?The ekg ordered today demonstrates sinus, LBBB ? ?Recent Labs: ?No results found for requested labs within last 8760 hours.  ? ?Lipid Panel ?No results found for: CHOL, TRIG, HDL, CHOLHDL, VLDL, LDLCALC, LDLDIRECT ?  ?Wt Readings from Last 3 Encounters:  ?04/27/21 172 lb (78 kg)  ?04/25/21 171 lb (77.6 kg)  ?03/15/21 171 lb 8 oz (77.8 kg)  ?  ? ?Assessment and Plan:  ? ?1. New LBBB: She has no symptoms suggestive of angina, CHF or arrhythmias. Will arrange an echo to assess LVEF and exclude structural heart disease. No ischemic testing is indicated.  ? ?Labs/ tests ordered today include:  ? ?Orders Placed This Encounter  ?Procedures  ? EKG 12-Lead  ? ECHOCARDIOGRAM COMPLETE  ? ?Disposition:   F/U with me as needed.  ? ?Signed, ?Lauree Chandler, MD ?04/27/2021 9:09 AM    ?[facility] ?[address], [place], [place]  [address] ?Phone: [phone]; Fax: [phone]  ? ? ?

## 2021-04-27 ENCOUNTER — Other Ambulatory Visit: Payer: Self-pay

## 2021-04-27 ENCOUNTER — Telehealth: Payer: Self-pay

## 2021-04-27 ENCOUNTER — Ambulatory Visit: Payer: BC Managed Care – PPO | Admitting: Cardiovascular Disease

## 2021-04-27 ENCOUNTER — Encounter: Payer: Self-pay | Admitting: Cardiovascular Disease

## 2021-04-27 VITALS — BP 134/66 | HR 99 | Ht 73.0 in | Wt 172.0 lb

## 2021-04-27 DIAGNOSIS — I447 Left bundle-branch block, unspecified: Secondary | ICD-10-CM | POA: Diagnosis not present

## 2021-04-27 NOTE — Patient Instructions (Signed)
Medication Instructions:  ?No changes ?*If you need a refill on your cardiac medications before your next appointment, please call your pharmacy* ? ? ?Lab Work: ?none ?If you have labs (blood work) drawn today and your tests are completely normal, you will receive your results only by: ?MyChart Message (if you have MyChart) OR ?A paper copy in the mail ?If you have any lab test that is abnormal or we need to change your treatment, we will call you to review the results. ? ? ?Testing/Procedures: ?Your physician has requested that you have an echocardiogram. Echocardiography is a painless test that uses sound waves to create images of your heart. It provides your doctor with information about the size and shape of your heart and how well your heart?s chambers and valves are working. This procedure takes approximately one hour. There are no restrictions for this procedure. ? ? ?Follow-Up: ?As needed ? ?  ?

## 2021-05-11 ENCOUNTER — Ambulatory Visit (HOSPITAL_COMMUNITY): Payer: BC Managed Care – PPO | Attending: Cardiology

## 2021-05-11 DIAGNOSIS — R9431 Abnormal electrocardiogram [ECG] [EKG]: Secondary | ICD-10-CM

## 2021-05-11 DIAGNOSIS — I447 Left bundle-branch block, unspecified: Secondary | ICD-10-CM

## 2021-05-11 LAB — ECHOCARDIOGRAM COMPLETE
Area-P 1/2: 4.23 cm2
S' Lateral: 3.1 cm

## 2021-05-15 ENCOUNTER — Telehealth: Payer: Self-pay | Admitting: Gastroenterology

## 2021-05-15 ENCOUNTER — Telehealth: Payer: Self-pay

## 2021-05-15 DIAGNOSIS — Z1211 Encounter for screening for malignant neoplasm of colon: Secondary | ICD-10-CM

## 2021-05-15 DIAGNOSIS — K5730 Diverticulosis of large intestine without perforation or abscess without bleeding: Secondary | ICD-10-CM

## 2021-05-15 DIAGNOSIS — Z86010 Personal history of colonic polyps: Secondary | ICD-10-CM

## 2021-05-15 MED ORDER — NA SULFATE-K SULFATE-MG SULF 17.5-3.13-1.6 GM/177ML PO SOLN: 1.0000 | Freq: Once | ORAL | 0 refills | Status: AC

## 2021-05-15 NOTE — Telephone Encounter (Signed)
? ? ?  Patient Name: Stacey Oconnell  ?DOB: May 26, 1960 ?MRN: [id number] ? ?Primary Cardiologist: Lauree Chandler, MD ? ?Chart reviewed as part of pre-operative protocol coverage. Given past medical history and time since last visit, based on ACC/AHA guidelines, [name] would be at acceptable risk for the planned procedure without further cardiovascular testing.  Recent echocardiogram showed normal ejection fraction, no significant valve issue.  No ischemic testing was felt to be indicated. ? ?The patient was advised that if she develops new symptoms prior to surgery to contact our office to arrange for a follow-up visit, and she verbalized understanding. ? ?I will route this recommendation to the requesting party via Epic fax function and remove from pre-op pool. ? ?Please call with questions. ? ?Almyra Deforest, [place] ?05/15/2021, 2:51 PM ? ?

## 2021-05-15 NOTE — Telephone Encounter (Signed)
Instructions done and prep sent to pharmacy.  ? ?LVM on home. Cell kept ringing. Mychart message sent ?

## 2021-05-15 NOTE — Telephone Encounter (Signed)
[facility] Pre-operative Risk Assessment  ?   ?Request for surgical clearance:     Endoscopy Procedure ? ?What type of surgery is being performed?     Colonoscopy ? ?When is this surgery scheduled?     06-15-2021 ? ?What type of clearance is required ?   Medical Clearance ? ?Are there any medications that need to be held prior to surgery and how long? No medications. We want to be sure this patient is cleared to have a colonoscopy ? ?Practice name and name of physician performing surgery?      [facility] ? ?What is your office phone and fax number?      Phone- [phone]  Fax- [phone] ? ?Anesthesia type (None, local, MAC, general) ?       [facility]  ?

## 2021-05-15 NOTE — Telephone Encounter (Signed)
Patient returned your phone call.  Please call back.  Thank you. 

## 2021-05-15 NOTE — Telephone Encounter (Signed)
Patient called and rescheduled her colonoscopy.  The new day and time is 06/15/21 at 2:30 p.m.  If you will, please, send her instructions to reflect this new time to her My Chart.  She will also need prep prescription sent to her pharmacy.  Thank you. ?

## 2021-05-21 NOTE — Telephone Encounter (Signed)
Spoke with patient and she has the prep and the instructions and didn't want to go it but was told to call back with any questions or concerns.  ?

## 2021-05-21 NOTE — Addendum Note (Signed)
Addended by: Curlene Labrum E on: 05/21/2021 04:48 PM ? ? Modules accepted: Orders ? ?

## 2021-05-21 NOTE — Telephone Encounter (Signed)
LVM for patient to call back. ?

## 2021-06-15 ENCOUNTER — Encounter: Payer: BC Managed Care – PPO | Admitting: Gastroenterology

## 2021-06-18 ENCOUNTER — Encounter: Payer: Self-pay | Admitting: Gastroenterology

## 2021-07-02 ENCOUNTER — Encounter: Payer: Self-pay | Admitting: Gastroenterology

## 2021-07-12 ENCOUNTER — Encounter: Payer: Self-pay | Admitting: Gastroenterology

## 2021-07-12 ENCOUNTER — Ambulatory Visit (AMBULATORY_SURGERY_CENTER): Payer: BC Managed Care – PPO | Admitting: Gastroenterology

## 2021-07-12 VITALS — BP 98/75 | HR 79 | Temp 97.5°F | Resp 17 | Ht 73.0 in | Wt 172.0 lb

## 2021-07-12 DIAGNOSIS — Z09 Encounter for follow-up examination after completed treatment for conditions other than malignant neoplasm: Secondary | ICD-10-CM

## 2021-07-12 DIAGNOSIS — Z86010 Personal history of colonic polyps: Secondary | ICD-10-CM

## 2021-07-12 DIAGNOSIS — D125 Benign neoplasm of sigmoid colon: Secondary | ICD-10-CM | POA: Diagnosis not present

## 2021-07-12 DIAGNOSIS — K5730 Diverticulosis of large intestine without perforation or abscess without bleeding: Secondary | ICD-10-CM

## 2021-07-12 DIAGNOSIS — D123 Benign neoplasm of transverse colon: Secondary | ICD-10-CM

## 2021-07-12 MED ORDER — SODIUM CHLORIDE 0.9 % IV SOLN: 500.0000 mL | Freq: Once | INTRAVENOUS | Status: DC

## 2021-07-12 NOTE — Op Note (Signed)
[place] Patient Name: Stacey Oconnell Procedure Date: 07/12/2021 1:58 PM MRN: [id number] Endoscopist: Gerrit Heck , MD Age: 61 Referring MD:  Date of Birth: 05/14/1960 Gender: Female Account #: [id number] Procedure:                Colonoscopy Indications:              Surveillance: Personal history of adenomatous                            polyps on last colonoscopy 3 years ago                           Last Colonoscopy was in 02/2018 and notable for 3                            small polyps (path:Tubular adenomas), 2 mm rectal                            polyp (path:HP), Diverticulosis, hypertrophied                            anal papilla. Normal TI.Recommended repeat in 3                            years. Medicines:                Monitored Anesthesia Care Procedure:                Pre-Anesthesia Assessment:                           - Prior to the procedure, a History and Physical                            was performed, and patient medications and                            allergies were reviewed. The patient's tolerance of                            previous anesthesia was also reviewed. The risks                            and benefits of the procedure and the sedation                            options and risks were discussed with the patient.                            All questions were answered, and informed consent                            was obtained. Prior Anticoagulants: The patient has  taken no previous anticoagulant or antiplatelet                            agents. ASA Grade Assessment: II - A patient with                            mild systemic disease. After reviewing the risks                            and benefits, the patient was deemed in                            satisfactory condition to undergo the procedure.                           After obtaining informed consent, the colonoscope                             was passed under direct vision. Throughout the                            procedure, the patient's blood pressure, pulse, and                            oxygen saturations were monitored continuously. The                            Olympus PCF-H190DL ([id number]) Colonoscope was                            introduced through the anus and advanced to the the                            terminal ileum. The colonoscopy was performed                            without difficulty. The patient tolerated the                            procedure well. The quality of the bowel                            preparation was good. The terminal ileum, ileocecal                            valve, appendiceal orifice, and rectum were                            photographed. Scope In: 2:17:14 PM Scope Out: 2:34:26 PM Scope Withdrawal Time: 0 hours 13 minutes 45 seconds  Total Procedure Duration: 0 hours 17 minutes 12 seconds  Findings:                 The perianal and digital rectal examinations were  normal.                           Two sessile polyps were found in the sigmoid colon                            and transverse colon. The polyps were 3 to 5 mm in                            size. These polyps were removed with a cold snare.                            Resection and retrieval were complete. Estimated                            blood loss was minimal.                           A 3 mm polyp was found in the distal sigmoid colon.                            The polyp was sessile. The polyp was removed with a                            cold snare. Resection and retrieval were complete.                            Estimated blood loss was minimal.                           Multiple small and large-mouthed diverticula were                            found in the sigmoid colon, descending colon and                            transverse colon.                            The retroflexed view of the distal rectum and anal                            verge was normal and showed no anal or rectal                            abnormalities.                           The terminal ileum appeared normal. Complications:            No immediate complications. Estimated Blood Loss:     Estimated blood loss was minimal. Impression:               - Two 3 to 5 mm polyps in the sigmoid colon and in  the transverse colon, removed with a cold snare.                            Resected and retrieved.                           - One 3 mm polyp in the distal sigmoid colon,                            removed with a cold snare. Resected and retrieved.                           - Diverticulosis in the sigmoid colon, in the                            descending colon and in the transverse colon.                           - The distal rectum and anal verge are normal on                            retroflexion view.                           - The examined portion of the ileum was normal. Recommendation:           - Patient has a contact number available for                            emergencies. The signs and symptoms of potential                            delayed complications were discussed with the                            patient. Return to normal activities tomorrow.                            Written discharge instructions were provided to the                            patient.                           - Resume previous diet.                           - Continue present medications.                           - Await pathology results.                           - Repeat colonoscopy for surveillance based on  pathology results.                           - Return to GI office PRN. Gerrit Heck, MD 07/12/2021 2:39:20 PM

## 2021-07-12 NOTE — Progress Notes (Signed)
Report to PACU, RN, vss, BBS= Clear.  

## 2021-07-12 NOTE — Progress Notes (Signed)
Called to room to assist during endoscopic procedure.  Patient ID and intended procedure confirmed with present staff. Received instructions for my participation in the procedure from the performing physician.  

## 2021-07-12 NOTE — Progress Notes (Signed)
VS completed by DT.  Pt's states no medical or surgical changes since previsit or office visit.  

## 2021-07-12 NOTE — Patient Instructions (Signed)
YOU HAD AN ENDOSCOPIC PROCEDURE TODAY AT THE [facility]:   Refer to the procedure report that was given to you for any specific questions about what was found during the examination.  If the procedure report does not answer your questions, please call your gastroenterologist to clarify.  If you requested that your care partner not be given the details of your procedure findings, then the procedure report has been included in a sealed envelope for you to review at your convenience later.  YOU SHOULD EXPECT: Some feelings of bloating in the abdomen. Passage of more gas than usual.  Walking can help get rid of the air that was put into your GI tract during the procedure and reduce the bloating. If you had a lower endoscopy (such as a colonoscopy or flexible sigmoidoscopy) you may notice spotting of blood in your stool or on the toilet paper. If you underwent a bowel prep for your procedure, you may not have a normal bowel movement for a few days.  Please Note:  You might notice some irritation and congestion in your nose or some drainage.  This is from the oxygen used during your procedure.  There is no need for concern and it should clear up in a day or so.  SYMPTOMS TO REPORT IMMEDIATELY:   Following lower endoscopy (colonoscopy or flexible sigmoidoscopy):  Excessive amounts of blood in the stool  Significant tenderness or worsening of abdominal pains  Swelling of the abdomen that is new, acute  Fever of 100F or higher   Following upper endoscopy (EGD)  Vomiting of blood or coffee ground material  New chest pain or pain under the shoulder blades  Painful or persistently difficult swallowing  New shortness of breath  Fever of 100F or higher  Black, tarry-looking stools  For urgent or emergent issues, a gastroenterologist can be reached at any hour by calling [phone]. Do not use MyChart messaging for urgent concerns.    DIET:  We do recommend a small meal at first, but  then you may proceed to your regular diet.  Drink plenty of fluids but you should avoid alcoholic beverages for 24 hours.  ACTIVITY:  You should plan to take it easy for the rest of today and you should NOT DRIVE or use heavy machinery until tomorrow (because of the sedation medicines used during the test).    FOLLOW UP: Our staff will call the number listed on your records 48-72 hours following your procedure to check on you and address any questions or concerns that you may have regarding the information given to you following your procedure. If we do not reach you, we will leave a message.  We will attempt to reach you two times.  During this call, we will ask if you have developed any symptoms of COVID 19. If you develop any symptoms (ie: fever, flu-like symptoms, shortness of breath, cough etc.) before then, please call [phone].  If you test positive for Covid 19 in the 2 weeks post procedure, please call and report this information to us.    If any biopsies were taken you will be contacted by phone or by letter within the next 1-3 weeks.  Please call us at [phone] if you have not heard about the biopsies in 3 weeks.    SIGNATURES/CONFIDENTIALITY: You and/or your care partner have signed paperwork which will be entered into your electronic medical record.  These signatures attest to the fact that that the information above on   your After Visit Summary has been reviewed and is understood.  Full responsibility of the confidentiality of this discharge information lies with you and/or your care-partner. 

## 2021-07-12 NOTE — Progress Notes (Signed)
GASTROENTEROLOGY PROCEDURE H&P NOTE   Primary Care Physician: Shawnee Knapp, MD    Reason for Procedure:   History of colon polyps, polyp surveillance  Plan:    Colonoscopy  Patient is appropriate for endoscopic procedure(s) in the ambulatory ([place]) setting.  The nature of the procedure, as well as the risks, benefits, and alternatives were carefully and thoroughly reviewed with the patient. Ample time for discussion and questions allowed. The patient understood, was satisfied, and agreed to proceed.     HPI: Stacey Oconnell is a 61 y.o. adult who presents for colonoscopy for ongoing polyp surveillance.   FHx n/f father with colon polyps requiring surgical resection (patient thinks aggressive histology polyp, but not CRC).    Endoscopic History: - Colonoscopy (03/2012, Dr. Candace Cruise): Normal.  Recommended repeat in 5 years for surveillance due to family history - Colonoscopy (02/2018, Dr. Bryan Lemma): 3 small polyps (path: Tubular adenomas), 2 mm rectal polyp (path: HP), Diverticulosis, hypertrophied anal papilla.  Normal TI.  Repeat in 3 years  Past Medical History:  Diagnosis Date   Allergy    seasonal   Dysplastic nevus 05/16/2015   Spinal upper back. Severe atypia, close to margin. Excised 06/05/2015, margins free.   Elevated prostate specific antigen (PSA) 2014   Pt report oncologist at [facility] suspected irritation from avid bike riding, nml since   Enlarged prostate    Hx of dysplastic nevus 2007   multiple sites   Hyperlipidemia    managed with Mediterranean diet and exercise, +FHx in father   Syncope     Past Surgical History:  Procedure Laterality Date   COLONOSCOPY  05/2012   Dr.Oh at [facility]/normal exam    COLONOSCOPY W/ POLYPECTOMY  03/13/2018   polyps   COLONOSCOPY WITH PROPOFOL  04/25/2021    Prior to Admission medications   Medication Sig Start Date End Date Taking? Authorizing Provider  estradiol (ESTRACE) 2 MG tablet Take 1 tablet (2 mg total) by mouth 3  (three) times daily. 01/20/18  Yes Shawnee Knapp, MD  fluticasone [facility]) 50 MCG/ACT nasal spray Place into both nostrils. 01/10/20  Yes [provider]  spironolactone (ALDACTONE) 100 MG tablet Take 1 tablet (100 mg total) by mouth 2 (two) times daily. 01/20/18  Yes Shawnee Knapp, MD  terbinafine (LAMISIL) 250 MG tablet Take 250 mg by mouth daily. 12/21/20  Yes [provider]  VITAMIN D PO Take 2,000 Units by mouth daily.   Yes [provider]    Current Outpatient Medications  Medication Sig Dispense Refill   estradiol (ESTRACE) 2 MG tablet Take 1 tablet (2 mg total) by mouth 3 (three) times daily. 270 tablet 3   fluticasone (FLONASE) 50 MCG/ACT nasal spray Place into both nostrils.     spironolactone (ALDACTONE) 100 MG tablet Take 1 tablet (100 mg total) by mouth 2 (two) times daily. 180 tablet 3   terbinafine (LAMISIL) 250 MG tablet Take 250 mg by mouth daily.     VITAMIN D PO Take 2,000 Units by mouth daily.     Current Facility-Administered Medications  Medication Dose Route Frequency Provider Last Rate Last Admin   0.9 %  sodium chloride infusion  500 mL Intravenous Once Aashritha Miedema V, DO        Allergies as of 07/12/2021   (No Known Allergies)    Family History  Problem Relation Age of Onset   Diabetes Mother    Hyperlipidemia Father    Hypertension Father    Colon  polyps Father    Breast cancer Paternal Aunt    Colon cancer Neg Hx    Esophageal cancer Neg Hx    Rectal cancer Neg Hx    Stomach cancer Neg Hx     Social History   Socioeconomic History   Marital status: Divorced    Spouse name: Not on file   Number of children: 0   Years of education: Not on file   Highest education level: Not on file  Occupational History   Occupation: Works in Radio producer  Tobacco Use   Smoking status: Never   Smokeless tobacco: Never  Vaping Use   Vaping Use: Never used  Substance and Sexual Activity   Alcohol use: Not Currently     Alcohol/week: 1.0 standard drink    Types: 1 Standard drinks or equivalent per week    Comment: Social   Drug use: Never   Sexual activity: Not on file    Comment: transgender, legal sex- female  Other Topics Concern   Not on file  Social History Narrative   Not on file   Social Determinants of Health   Financial Resource Strain: Not on file  Food Insecurity: Not on file  Transportation Needs: Not on file  Physical Activity: Not on file  Stress: Not on file  Social Connections: Not on file  Intimate Partner Violence: Not on file    Physical Exam: Vital signs in last 24 hours: '@BP'$  136/87   Pulse 95   Temp (!) 97.5 F (36.4 C) (Temporal)   Resp 11   Ht '6\' 1"'$  (1.854 m)   Wt 172 lb (78 kg)   SpO2 100%   BMI 22.69 kg/m  GEN: NAD EYE: Sclerae anicteric ENT: MMM CV: Non-tachycardic Pulm: CTA b/l GI: Soft, NT/ND NEURO:  Alert & Oriented x 3   Gerrit Heck, DO [facility]   07/12/2021 2:10 PM

## 2021-07-13 ENCOUNTER — Telehealth: Payer: Self-pay | Admitting: *Deleted

## 2021-07-13 NOTE — Telephone Encounter (Signed)
  Follow up Call-     07/12/2021    1:43 PM 04/25/2021    8:31 AM  Call back number  Post procedure Call Back phone  # [phone]  Permission to leave phone message Yes Yes     Patient questions:  Do you have a fever, pain , or abdominal swelling? No. Pain Score  0 *  Have you tolerated food without any problems? Yes.    Have you been able to return to your normal activities? Yes.    Do you have any questions about your discharge instructions: Diet   No. Medications  No. Follow up visit  No.  Do you have questions or concerns about your Care? No.  Actions: * If pain score is 4 or above: No action needed, pain <4.

## 2021-07-18 ENCOUNTER — Encounter: Payer: Self-pay | Admitting: Gastroenterology

## 2021-08-13 NOTE — Telephone Encounter (Signed)
Post op follow up call, no answer

## 2022-02-12 ENCOUNTER — Ambulatory Visit: Payer: BC Managed Care – PPO | Admitting: Dermatology

## 2022-02-12 VITALS — BP 121/84 | HR 85

## 2022-02-12 DIAGNOSIS — L821 Other seborrheic keratosis: Secondary | ICD-10-CM

## 2022-02-12 DIAGNOSIS — Z1283 Encounter for screening for malignant neoplasm of skin: Secondary | ICD-10-CM | POA: Diagnosis not present

## 2022-02-12 DIAGNOSIS — D225 Melanocytic nevi of trunk: Secondary | ICD-10-CM

## 2022-02-12 DIAGNOSIS — L814 Other melanin hyperpigmentation: Secondary | ICD-10-CM

## 2022-02-12 DIAGNOSIS — L578 Other skin changes due to chronic exposure to nonionizing radiation: Secondary | ICD-10-CM

## 2022-02-12 DIAGNOSIS — D239 Other benign neoplasm of skin, unspecified: Secondary | ICD-10-CM

## 2022-02-12 DIAGNOSIS — D229 Melanocytic nevi, unspecified: Secondary | ICD-10-CM

## 2022-02-12 DIAGNOSIS — D2272 Melanocytic nevi of left lower limb, including hip: Secondary | ICD-10-CM

## 2022-02-12 NOTE — Patient Instructions (Signed)
Due to recent changes in healthcare laws, you may see results of your pathology and/or laboratory studies on MyChart before the doctors have had a chance to review them. We understand that in some cases there may be results that are confusing or concerning to you. Please understand that not all results are received at the same time and often the doctors may need to interpret multiple results in order to provide you with the best plan of care or course of treatment. Therefore, we ask that you please give us 2 business days to thoroughly review all your results before contacting the office for clarification. Should we see a critical lab result, you will be contacted sooner.   If You Need Anything After Your Visit  If you have any questions or concerns for your doctor, please call our main line at [phone] and press option 4 to reach your doctor's medical assistant. If no one answers, please leave a voicemail as directed and we will return your call as soon as possible. Messages left after 4 pm will be answered the following business day.   You may also send us a message via MyChart. We typically respond to MyChart messages within 1-2 business days.  For prescription refills, please ask your pharmacy to contact our office. Our fax number is [phone].  If you have an urgent issue when the clinic is closed that cannot wait until the next business day, you can page your doctor at the number below.    Please note that while we do our best to be available for urgent issues outside of office hours, we are not available 24/7.   If you have an urgent issue and are unable to reach us, you may choose to seek medical care at your doctor's office, retail clinic, urgent care center, or emergency room.  If you have a medical emergency, please immediately call 911 or go to the emergency department.  Pager Numbers  - Dr. Kowalski: [phone]  - Dr. Moye: [phone]  - Dr. Stewart:  [phone]  In the event of inclement weather, please call our main line at [phone] for an update on the status of any delays or closures.  Dermatology Medication Tips: Please keep the boxes that topical medications come in in order to help keep track of the instructions about where and how to use these. Pharmacies typically print the medication instructions only on the boxes and not directly on the medication tubes.   If your medication is too expensive, please contact our office at [phone] option 4 or send us a message through MyChart.   We are unable to tell what your co-pay for medications will be in advance as this is different depending on your insurance coverage. However, we may be able to find a substitute medication at lower cost or fill out paperwork to get insurance to cover a needed medication.   If a prior authorization is required to get your medication covered by your insurance company, please allow us 1-2 business days to complete this process.  Drug prices often vary depending on where the prescription is filled and some pharmacies may offer cheaper prices.  The website www.goodrx.com contains coupons for medications through different pharmacies. The prices here do not account for what the cost may be with help from insurance (it may be cheaper with your insurance), but the website can give you the price if you did not use any insurance.  - You can print the associated coupon and take it with   your prescription to the pharmacy.  - You may also stop by our office during regular business hours and pick up a GoodRx coupon card.  - If you need your prescription sent electronically to a different pharmacy, notify our office through [facility] MyChart or by phone at [phone] option 4.     Si Usted Necesita Algo Despus de Su Visita  Tambin puede enviarnos un mensaje a travs de MyChart. Por lo general respondemos a los mensajes de MyChart en el transcurso de 1 a 2  das hbiles.  Para renovar recetas, por favor pida a su farmacia que se ponga en contacto con nuestra oficina. Nuestro nmero de fax es el [phone].  Si tiene un asunto urgente cuando la clnica est cerrada y que no puede esperar hasta el siguiente da hbil, puede llamar/localizar a su doctor(a) al nmero que aparece a continuacin.   Por favor, tenga en cuenta que aunque hacemos todo lo posible para estar disponibles para asuntos urgentes fuera del horario de oficina, no estamos disponibles las 24 horas del da, los 7 das de la semana.   Si tiene un problema urgente y no puede comunicarse con nosotros, puede optar por buscar atencin mdica  en el consultorio de su doctor(a), en una clnica privada, en un centro de atencin urgente o en una sala de emergencias.  Si tiene una emergencia mdica, por favor llame inmediatamente al 911 o vaya a la sala de emergencias.  Nmeros de bper  - Dr. Kowalski: [phone]  - Dra. Moye: [phone]  - Dra. Stewart: [phone]  En caso de inclemencias del tiempo, por favor llame a nuestra lnea principal al [phone] para una actualizacin sobre el estado de cualquier retraso o cierre.  Consejos para la medicacin en dermatologa: Por favor, guarde las cajas en las que vienen los medicamentos de uso tpico para ayudarle a seguir las instrucciones sobre dnde y cmo usarlos. Las farmacias generalmente imprimen las instrucciones del medicamento slo en las cajas y no directamente en los tubos del medicamento.   Si su medicamento es muy caro, por favor, pngase en contacto con nuestra oficina llamando al [phone] y presione la opcin 4 o envenos un mensaje a travs de MyChart.   No podemos decirle cul ser su copago por los medicamentos por adelantado ya que esto es diferente dependiendo de la cobertura de su seguro. Sin embargo, es posible que podamos encontrar un medicamento sustituto a menor costo o llenar un formulario para que el  seguro cubra el medicamento que se considera necesario.   Si se requiere una autorizacin previa para que su compaa de seguros cubra su medicamento, por favor permtanos de 1 a 2 das hbiles para completar este proceso.  Los precios de los medicamentos varan con frecuencia dependiendo del lugar de dnde se surte la receta y alguna farmacias pueden ofrecer precios ms baratos.  El sitio web www.goodrx.com tiene cupones para medicamentos de diferentes farmacias. Los precios aqu no tienen en cuenta lo que podra costar con la ayuda del seguro (puede ser ms barato con su seguro), pero el sitio web puede darle el precio si no utiliz ningn seguro.  - Puede imprimir el cupn correspondiente y llevarlo con su receta a la farmacia.  - Tambin puede pasar por nuestra oficina durante el horario de atencin regular y recoger una tarjeta de cupones de GoodRx.  - Si necesita que su receta se enve electrnicamente a una farmacia diferente, informe a nuestra oficina a travs de MyChart de [place]   o por telfono llamando al [phone] y presione la opcin 4.  

## 2022-02-12 NOTE — Progress Notes (Signed)
Follow-Up Visit   Subjective  Stacey Oconnell is a 62 y.o. adult who presents for the following: Total body skin exam (Hx of Dysplastic Nevi).  The patient presents for Total-Body Skin Exam (TBSE) for skin cancer screening and mole check.  The patient has spots, moles and lesions to be evaluated, some may be new or changing and the patient has concerns that these could be cancer. He had benign growth under his eye removed by plastic surgeon last year.  Hasn't grown back.  Nothing is bothering him at this time.   The following portions of the chart were reviewed this encounter and updated as appropriate:       Review of Systems:  No other skin or systemic complaints except as noted in HPI or Assessment and Plan.  Objective  Well appearing patient in no apparent distress; mood and affect are within normal limits.  A full examination was performed including scalp, head, eyes, ears, nose, lips, neck, chest, axillae, abdomen, back, buttocks, bilateral upper extremities, bilateral lower extremities, hands, feet, fingers, toes, fingernails, and toenails. All findings within normal limits unless otherwise noted below.  L 4th toe webspace; back  L 4th toe webspace 4.19m med light brown macule  back Small scattered regular dark brown macules    Assessment & Plan   Lentigines - Scattered tan macules - Due to sun exposure - Benign-appearing, observe - Recommend daily broad spectrum sunscreen SPF 30+ to sun-exposed areas, reapply every 2 hours as needed. - Call for any changes - arms  Seborrheic Keratoses - Stuck-on, waxy, tan-brown papules and/or plaques  - Benign-appearing - Discussed benign etiology and prognosis. - Observe - Call for any changes - back, abdomen  Melanocytic Nevi - Tan-brown and/or pink-flesh-colored symmetric macules and papules - Benign appearing on exam today - Observation - Call clinic for new or changing moles - Recommend daily use of broad  spectrum spf 30+ sunscreen to sun-exposed areas.  - back, abdomen  Hemangiomas - Red papules - Discussed benign nature - Observe - Call for any changes - back, abdomen  Actinic Damage - Chronic condition, secondary to cumulative UV/sun exposure - diffuse scaly erythematous macules with underlying dyspigmentation - Recommend daily broad spectrum sunscreen SPF 30+ to sun-exposed areas, reapply every 2 hours as needed.  - Staying in the shade or wearing long sleeves, sun glasses (UVA+UVB protection) and wide brim hats (4-inch brim around the entire circumference of the hat) are also recommended for sun protection.  - Call for new or changing lesions.  Skin cancer screening performed today.   Dermatofibroma - Firm pink/brown papulenodule with dimple sign - Benign appearing - Call for any changes  - L post shoulder  History of Dysplastic Nevi - No evidence of recurrence today - Recommend regular full body skin exams - Recommend daily broad spectrum sunscreen SPF 30+ to sun-exposed areas, reapply every 2 hours as needed.  - Call if any new or changing lesions are noted between office visits   Nevus (2) L 4th toe webspace; back  Benign-appearing.  Observation.  Call clinic for new or changing lesions.  Recommend daily use of broad spectrum spf 30+ sunscreen to sun-exposed areas.     Return in about 1 year (around 02/13/2023) for TBSE, Hx of Dysplastic nevi.  I, SOthelia Pulling RMA, am acting as scribe for TBrendolyn Patty MD .  Documentation: I have reviewed the above documentation for accuracy and completeness, and I agree with the above.  TBrendolyn PattyMD

## 2022-02-18 ENCOUNTER — Other Ambulatory Visit: Payer: Self-pay | Admitting: Infectious Diseases

## 2022-02-18 DIAGNOSIS — Z1231 Encounter for screening mammogram for malignant neoplasm of breast: Secondary | ICD-10-CM

## 2022-04-11 ENCOUNTER — Ambulatory Visit
Admission: RE | Admit: 2022-04-11 | Discharge: 2022-04-11 | Disposition: A | Discharge status: Home / Self Care | Payer: BC Managed Care – PPO | Source: Ambulatory Visit | Attending: Infectious Diseases | Admitting: Infectious Diseases

## 2022-04-11 DIAGNOSIS — Z1231 Encounter for screening mammogram for malignant neoplasm of breast: Secondary | ICD-10-CM

## 2023-01-17 ENCOUNTER — Emergency Department
Admission: EM | Admit: 2023-01-17 | Discharge: 2023-01-17 | Disposition: A | Discharge status: Home / Self Care | Payer: BC Managed Care – PPO | Attending: Emergency Medicine | Admitting: Emergency Medicine

## 2023-01-17 ENCOUNTER — Emergency Department: Payer: BC Managed Care – PPO

## 2023-01-17 ENCOUNTER — Other Ambulatory Visit: Payer: Self-pay

## 2023-01-17 ENCOUNTER — Encounter: Payer: Self-pay | Admitting: Emergency Medicine

## 2023-01-17 DIAGNOSIS — I82451 Acute embolism and thrombosis of right peroneal vein: Secondary | ICD-10-CM | POA: Insufficient documentation

## 2023-01-17 DIAGNOSIS — M79661 Pain in right lower leg: Secondary | ICD-10-CM | POA: Diagnosis present

## 2023-01-17 MED ORDER — APIXABAN (ELIQUIS) VTE STARTER PACK (10MG AND 5MG): ORAL_TABLET | ORAL | 0 refills | Status: AC

## 2023-01-17 NOTE — ED Provider Notes (Signed)
[facility] Provider Note    Event Date/Time   First MD Initiated Contact with Patient 01/17/23 1645     (approximate)   History   Leg Pain   HPI  Stacey Oconnell is a 62 y.o. adult with history of syncope, hyperlipidemia, elevated PSA, estrogen therapy presents emergency department with pain in the right calf.  Patient states its long side.  No known injury.  No recent travel.  Non-smoker.  Patient had recently decreased the amount of estrogen as her doctor told her the levels were too high.  Had decreased from 3 pills to 2 pills.  No chest pain or shortness of breath.     Physical Exam   Triage Vital Signs: ED Triage Vitals  Encounter Vitals Group     BP 01/17/23 1408 (!) 128/91     Systolic BP Percentile --      Diastolic BP Percentile --      Pulse Rate 01/17/23 1408 98     Resp 01/17/23 1408 18     Temp 01/17/23 1408 97.7 F (36.5 C)     Temp Source 01/17/23 1408 Oral     SpO2 01/17/23 1408 100 %     Weight 01/17/23 1409 176 lb (79.8 kg)     Height 01/17/23 1409 6\' 1"  (1.854 m)     Head Circumference --      Peak Flow --      Pain Score 01/17/23 1408 1     Pain Loc --      Pain Education --      Exclude from Growth Chart --     Most recent vital signs: Vitals:   01/17/23 1408  BP: (!) 128/91  Pulse: 98  Resp: 18  Temp: 97.7 F (36.5 C)  SpO2: 100%     General: Awake, no distress.   CV:  Good peripheral perfusion. regular rate and  rhythm Resp:  Normal effort. Lungs cta Abd:  No distention.   Other:  Right calf tender along the lateral aspect, no redness or swelling   ED Results / Procedures / Treatments   Labs (all labs ordered are listed, but only abnormal results are displayed) Labs Reviewed - No data to display   EKG     RADIOLOGY Ultrasound for DVT    PROCEDURES:   Procedures   MEDICATIONS ORDERED IN ED: Medications - No data to display   IMPRESSION / MDM / ASSESSMENT AND PLAN / ED COURSE   I reviewed the triage vital signs and the nursing notes.                              Differential diagnosis includes, but is not limited to, DVT, muscle strain, contusion  Patient's presentation is most consistent with acute presentation with potential threat to life or bodily function.  . Due to the patient's hormone therapy along with right calf pain we will go ahead and order ultrasound for DVT.   Ultrasound independently reviewed interpreted by me as being positive for DVT of the peroneal vein.  Due to estrogen therapy patient should be placed on Eliquis.  She is to follow-up with vascular.  Patient would prefer to see Dr. Wyn Quaker..  Starter pack for Eliquis ordered.  Patient is in agreement treatment plan.  Applications of Eliquis discussed with patient.  Discharged in stable condition.   FINAL CLINICAL IMPRESSION(S) / ED DIAGNOSES   Final diagnoses:  Acute deep vein thrombosis (DVT) of right peroneal vein (HCC)     Rx / DC Orders   ED Discharge Orders          Ordered    APIXABAN (ELIQUIS) VTE STARTER PACK (10MG  AND 5MG )       Note to Pharmacy: If starter pack unavailable, substitute with seventy-four 5 mg apixaban tabs following the above SIG directions.   01/17/23 1706             Note:  This document was prepared using Dragon voice recognition software and may include unintentional dictation errors.    Faythe Ghee, PA-C 01/17/23 1718    Willy Eddy, MD 01/17/23 [phone]

## 2023-01-17 NOTE — ED Triage Notes (Signed)
Patient to ED via POV for right calf pain. States cramping feeling for a few days. No swelling noted. Hormone recently reduced.

## 2023-01-17 NOTE — ED Provider Triage Note (Signed)
Emergency Medicine Provider Triage Evaluation Note  Stacey Oconnell , a 62 y.o. adult  was evaluated in triage.  Pt complains of right leg pain, taking hormone therapy.  Review of Systems  Positive:  Negative:   Physical Exam  BP (!) 128/91 (BP Location: Left Arm)   Pulse 98   Temp 97.7 F (36.5 C) (Oral)   Resp 18   Ht 6\' 1"  (1.854 m)   Wt 79.8 kg   SpO2 100%   BMI 23.22 kg/m  Gen:   Awake, no distress   Resp:  Normal effort  MSK:   Moves extremities without difficulty, minimal tenderness right lower extremity Other:    Medical Decision Making  Medically screening exam initiated at 2:09 PM.  Appropriate orders placed.  Stacey Oconnell was informed that the remainder of the evaluation will be completed by another provider, this initial triage assessment does not replace that evaluation, and the importance of remaining in the ED until their evaluation is complete.  Due to vomiting therapy along with calf pain we will do ultrasound for DVT   Faythe Ghee, PA-C 01/17/23 1410

## 2023-01-20 ENCOUNTER — Telehealth (INDEPENDENT_AMBULATORY_CARE_PROVIDER_SITE_OTHER): Payer: Self-pay

## 2023-01-20 NOTE — Telephone Encounter (Signed)
Patient reach out to see if it was fine to use a Neurosurgeon. Patient was seen in ER for dvt. Patient was informed that it is fine to use the electric razor.

## 2023-01-31 ENCOUNTER — Encounter (INDEPENDENT_AMBULATORY_CARE_PROVIDER_SITE_OTHER): Payer: Self-pay | Admitting: Vascular Surgery

## 2023-01-31 ENCOUNTER — Ambulatory Visit (INDEPENDENT_AMBULATORY_CARE_PROVIDER_SITE_OTHER): Payer: BC Managed Care – PPO | Admitting: Vascular Surgery

## 2023-01-31 VITALS — BP 144/89 | HR 93 | Resp 18 | Ht 73.0 in | Wt 174.4 lb

## 2023-01-31 DIAGNOSIS — I82451 Acute embolism and thrombosis of right peroneal vein: Secondary | ICD-10-CM | POA: Diagnosis not present

## 2023-01-31 DIAGNOSIS — I82409 Acute embolism and thrombosis of unspecified deep veins of unspecified lower extremity: Secondary | ICD-10-CM | POA: Insufficient documentation

## 2023-01-31 DIAGNOSIS — Z789 Other specified health status: Secondary | ICD-10-CM | POA: Diagnosis not present

## 2023-01-31 MED ORDER — APIXABAN 5 MG PO TABS: 5.0000 mg | ORAL_TABLET | Freq: Two times a day (BID) | ORAL | 2 refills | Status: DC

## 2023-01-31 NOTE — Addendum Note (Signed)
Addended by: Annice Needy on: 01/31/2023 11:12 AM   Modules accepted: Orders

## 2023-01-31 NOTE — Assessment & Plan Note (Signed)
The patient has an acute right peroneal vein DVT.  She has been on appropriate anticoagulation now for about 2 weeks.  For a tibial vein DVT, 6 weeks of anticoagulation should be adequate.  Her symptoms are essentially resolved at this point.  If she develops any swelling she should get compression socks and elevate her legs.  Plan follow-up in about 4 weeks with a repeat duplex study to ensure that it is safe to stop anticoagulation.

## 2023-01-31 NOTE — Assessment & Plan Note (Signed)
Is on estradiol and this at least has some potential to contribute to thrombotic issues although the dose is not excessive and I do not think stopping is really necessary with just a calf vein DVT.

## 2023-01-31 NOTE — Progress Notes (Signed)
Patient ID: Stacey Oconnell, adult   DOB: Jun 12, 1960, 62 y.o.   MRN: [id number]  Chief Complaint  Patient presents with   New Patient (Initial Visit)    np. consult. DVT found at [facility] on 12.06.24. fitzgerald, david    HPI Stacey Oconnell is a 62 y.o. adult.  I am asked to see the patient by DR. Shaw for evaluation of right peroneal vein DVT.  This was found about 2 weeks ago when she was having pain in her calf associated with local swelling.  She said it felt like a cramp.  She works out frequently and said that she thought she had just pulled a muscle or had a cramp.  When the symptoms persisted and worsened, she went for an ultrasound 2 weeks ago which showed a right peroneal vein DVT.  After 2 weeks of anticoagulation, the pain has subsided to minimal at this point.  There is no appreciable swelling at this point.  She had had no recent surgery, trauma, or injuries.  She has been on estradiol but otherwise has no obvious risk factors.  No strong family history or personal history of thrombotic issues.     Past Medical History:  Diagnosis Date   Allergy    seasonal   Dysplastic nevus 05/16/2015   Spinal upper back. Severe atypia, close to margin. Excised 06/05/2015, margins free.   Elevated prostate specific antigen (PSA) 2014   Pt report oncologist at [facility] suspected irritation from avid bike riding, nml since   Enlarged prostate    Hx of dysplastic nevus 2007   multiple sites   Hyperlipidemia    managed with Mediterranean diet and exercise, +FHx in father   Syncope     Past Surgical History:  Procedure Laterality Date   COLONOSCOPY  05/2012   Dr.Oh at [facility]/normal exam    COLONOSCOPY W/ POLYPECTOMY  03/13/2018   polyps   COLONOSCOPY WITH PROPOFOL  04/25/2021     Family History  Problem Relation Age of Onset   Diabetes Mother    Hyperlipidemia Father    Hypertension Father    Colon polyps Father    Breast cancer Paternal Aunt    Colon cancer Neg Hx     Esophageal cancer Neg Hx    Rectal cancer Neg Hx    Stomach cancer Neg Hx      Social History   Tobacco Use   Smoking status: Never   Smokeless tobacco: Never  Vaping Use   Vaping status: Never Used  Substance Use Topics   Alcohol use: Not Currently    Alcohol/week: 1.0 standard drink of alcohol    Types: 1 Standard drinks or equivalent per week    Comment: Social   Drug use: Never     No Known Allergies  Current Outpatient Medications  Medication Sig Dispense Refill   APIXABAN (ELIQUIS) VTE STARTER PACK (10MG  AND 5MG ) Take as directed on package: start with two-5mg  tablets twice daily for 7 days. On day 8, switch to one-5mg  tablet twice daily. 74 each 0   Cholecalciferol (VITAMIN D3) 100000 UNIT/GM POWD Take by mouth.     estradiol (ESTRACE) 2 MG tablet Take 1 tablet (2 mg total) by mouth 3 (three) times daily. 270 tablet 3   fluticasone (FLONASE) 50 MCG/ACT nasal spray Place into both nostrils.     spironolactone (ALDACTONE) 100 MG tablet Take 1 tablet (100 mg total) by mouth 2 (two) times daily. 180 tablet 3   VITAMIN D  PO Take 2,000 Units by mouth daily.     terbinafine (LAMISIL) 250 MG tablet Take 250 mg by mouth daily. (Patient not taking: Reported on 01/31/2023)     No current facility-administered medications for this visit.      REVIEW OF SYSTEMS (Negative unless checked)  Constitutional: [] Weight loss  [] Fever  [] Chills Cardiac: [] Chest pain   [] Chest pressure   [] Palpitations   [] Shortness of breath when laying flat   [] Shortness of breath at rest   [] Shortness of breath with exertion. Vascular:  [] Pain in legs with walking   [] Pain in legs at rest   [] Pain in legs when laying flat   [] Claudication   [] Pain in feet when walking  [] Pain in feet at rest  [] Pain in feet when laying flat   [x] History of DVT   [x] Phlebitis   [x] Swelling in legs   [] Varicose veins   [] Non-healing ulcers Pulmonary:   [] Uses home oxygen   [] Productive cough   [] Hemoptysis   [] Wheeze   [] COPD   [] Asthma Neurologic:  [] Dizziness  [] Blackouts   [] Seizures   [] History of stroke   [] History of TIA  [] Aphasia   [] Temporary blindness   [] Dysphagia   [] Weakness or numbness in arms   [] Weakness or numbness in legs Musculoskeletal:  [] Arthritis   [] Joint swelling   [] Joint pain   [] Low back pain Hematologic:  [] Easy bruising  [] Easy bleeding   [] Hypercoagulable state   [] Anemic  [] Hepatitis Gastrointestinal:  [] Blood in stool   [] Vomiting blood  [] Gastroesophageal reflux/heartburn   [] Abdominal pain Genitourinary:  [] Chronic kidney disease   [] Difficult urination  [] Frequent urination  [] Burning with urination   [] Hematuria Skin:  [] Rashes   [] Ulcers   [] Wounds Psychological:  [] History of anxiety   []  History of major depression.    Physical Exam BP (!) 144/89   Pulse 93   Resp 18   Ht 6\' 1"  (1.854 m)   Wt 174 lb 6.4 oz (79.1 kg)   BMI 23.01 kg/m  Gen:  WD/WN, NAD. Appears younger than stated age. Head: /AT, No temporalis wasting.  Ear/Nose/Throat: Hearing grossly intact, nares w/o erythema or drainage, oropharynx w/o Erythema/Exudate Eyes: Conjunctiva clear, sclera non-icteric  Neck: trachea midline.  No JVD.  Pulmonary:  Good air movement, respirations not labored, no use of accessory muscles  Cardiac: RRR, no JVD Vascular: No Vessel Right Left  Radial Palpable Palpable                                   Gastrointestinal:. No masses, surgical incisions, or scars. Musculoskeletal: M/S 5/5 throughout.  Extremities without ischemic changes.  No deformity or atrophy. No edema. Neurologic: Sensation grossly intact in extremities.  Symmetrical.  Speech is fluent. Motor exam as listed above. Psychiatric: Judgment intact, Mood & affect appropriate for pt's clinical situation. Dermatologic: No rashes or ulcers noted.  No cellulitis or open wounds.    Radiology US Venous Img Lower Unilateral Right Result Date: 01/17/2023 CLINICAL DATA:  62 year old female with  right calf pain for 2 days. EXAM: RIGHT LOWER EXTREMITY VENOUS DOPPLER ULTRASOUND TECHNIQUE: Gray-scale sonography with graded compression, as well as color Doppler and duplex ultrasound were performed to evaluate the right lower extremity deep venous systems from the level of the common femoral vein and including the common femoral, femoral, profunda femoral, popliteal and calf veins including the posterior tibial, peroneal and gastrocnemius veins when visible. Spectral Doppler was  utilized to evaluate flow at rest and with distal augmentation maneuvers in the common femoral, femoral and popliteal veins. The contralateral common femoral vein was also evaluated for comparison. COMPARISON:  None Available. FINDINGS: RIGHT LOWER EXTREMITY Common Femoral Vein: No evidence of thrombus. Normal compressibility, respiratory phasicity and response to augmentation. Central Greater Saphenous Vein: No evidence of thrombus. Normal compressibility and flow on color Doppler imaging. Central Profunda Femoral Vein: No evidence of thrombus. Normal compressibility and flow on color Doppler imaging. Femoral Vein: No evidence of thrombus. Normal compressibility, respiratory phasicity and response to augmentation. Popliteal Vein: No evidence of thrombus. Normal compressibility, respiratory phasicity and response to augmentation. Calf Veins: Occlusive, minimally expansile, heterogeneously hypoechoic thrombus visualized in the peroneal vein. The posterior tibial vein appears patent. Other Findings:  None. LEFT LOWER EXTREMITY Common Femoral Vein: No evidence of thrombus. Normal compressibility, respiratory phasicity and response to augmentation. IMPRESSION: Acute appearing deep vein thrombosis limited to the peroneal vein. No evidence of iliofemoral extension. Marliss Coots, MD [facility] Electronically Signed   By: Marliss Coots M.D.   On: 01/17/2023 16:42    Labs No results  found for this or any previous visit (from the past 2160 hours).  Assessment/Plan:  DVT (deep venous thrombosis) (HCC) The patient has an acute right peroneal vein DVT.  She has been on appropriate anticoagulation now for about 2 weeks.  For a tibial vein DVT, 6 weeks of anticoagulation should be adequate.  Her symptoms are essentially resolved at this point.  If she develops any swelling she should get compression socks and elevate her legs.  Plan follow-up in about 4 weeks with a repeat duplex study to ensure that it is safe to stop anticoagulation.  Female-to-female transgender person Is on estradiol and this at least has some potential to contribute to thrombotic issues although the dose is not excessive and I do not think stopping is really necessary with just a calf vein DVT.      Festus Barren 01/31/2023, 10:47 AM   This note was created with Dragon medical transcription system.  Any errors from dictation are unintentional.

## 2023-01-31 NOTE — Patient Instructions (Addendum)
Deep Vein Thrombosis  Deep vein thrombosis (DVT) is a condition in which a blood clot forms in a vein of the deep venous system. This can occur in the lower leg, thigh, pelvis, arm, or neck. A clot is blood that has thickened into a gel or solid. This condition is serious and can be life-threatening if the clot travels to the arteries of the lungs and causes a blockage (pulmonary embolism). A DVT can also damage veins in the leg, which can lead to long-term venous disease, leg pain, swelling, discoloration, and ulcers or sores (post-thrombotic syndrome). What are the causes? This condition may be caused by: A slowdown of blood flow. Damage to a vein. A condition that causes blood to clot more easily, such as certain bleeding disorders. What increases the risk? The following factors may make you more likely to develop this condition: Obesity. Being older, especially older than age 60. Being inactive or not moving around (sedentary lifestyle). This may include: Sitting or lying down for longer than 4-6 hours other than to sleep at night. Being in the hospital, or having major or lengthy surgery. Having any recent bone injuries, such as breaks (fractures), that reduce movement, especially in the lower extremities. Having recent orthopedic surgery on the lower extremities. Being pregnant, giving birth, or having recently given birth. Taking medicines that contain estrogen, such as birth control or hormone replacement therapy. Using products that contain nicotine or tobacco, especially if you use hormonal birth control. Having a history of a blood vessel disease (peripheral vascular disease) or congestive heart disease. Having a history of cancer, especially if being treated with chemotherapy. What are the signs or symptoms? Symptoms of this condition include: Swelling, pain, pressure, or tenderness in an arm or a leg. An arm or a leg becoming warm, red, or discolored. A leg turning very pale or  blue. You may have a large DVT. This is rare. If the clot is in your leg, you may notice that symptoms get worse when you stand or walk. In some cases, there are no symptoms. How is this diagnosed? This condition is diagnosed with: Your medical history and a physical exam. Tests, such as: Blood tests to check how well your blood clots. Doppler ultrasound. This is the best way to find a DVT. CT venogram. Contrast dye is injected into a vein, and X-rays are taken to check for clots. This is helpful for veins in the chest or pelvis. How is this treated? Treatment for this condition depends on: The cause of your DVT. The size and location of your DVT, or having more than one DVT. Your risk for bleeding or developing more clots. Other medical conditions you may have. Treatment may include: Taking a blood thinner medicine (anticoagulant) to prevent more clots from forming or current clots from growing. Wearing compression stockings. Injecting medicines into the affected vein to break up the clot (catheter-directed thrombolysis). Surgical procedures, when DVT is severe or hard to treat. These may be done to: Isolate and remove your clot. Place an inferior vena cava (IVC) filter. This filter is placed into a large vein called the inferior vena cava to catch blood clots before they reach your lungs. You may get some medical treatments for 6 months or longer. Follow these instructions at home: If you are taking blood thinners: Talk with your health care provider before you take any medicines that contain aspirin or NSAIDs, such as ibuprofen. These medicines increase your risk for dangerous bleeding. Take your medicine exactly   as told, at the same time every day. Do not skip a dose. Do not take more than the prescribed dose. This is important. Ask your health care provider about foods and medicines that could change or interact with the way your blood thinner works. Avoid these foods and medicines  if you are told to do so. Avoid anything that may cause bleeding or bruising. You may bleed more easily while taking blood thinners. Be very careful when using knives, scissors, or other sharp objects. Use an electric razor instead of a blade. Avoid activities that could cause injury or bruising, and follow instructions for preventing falls. Tell your health care provider if you have had any internal bleeding, bleeding ulcers, or neurologic diseases, such as strokes or cerebral aneurysms. Wear a medical alert bracelet or carry a card that lists what medicines you take. General instructions Take over-the-counter and prescription medicines only as told by your health care provider. Return to your normal activities as told by your health care provider. Ask your health care provider what activities are safe for you. If recommended, wear compression stockings as told by your health care provider. These stockings help to prevent blood clots and reduce swelling in your legs. Never wear your compression stockings while sleeping at night. Keep all follow-up visits. This is important. Where to find more information American Heart Association: www.heart.org Centers for Disease Control and Prevention: www.cdc.gov National Heart, Lung, and Blood Institute: www.nhlbi.nih.gov Contact a health care provider if: You miss a dose of your blood thinner. You have unusual bruising or other color changes. You have new or worse pain, swelling, or redness in an arm or a leg. You have worsening numbness or tingling in an arm or a leg. You have a significant color change (pale or blue) in the extremity that has the DVT. Get help right away if: You have signs or symptoms that a blood clot has moved to the lungs. These may include: Shortness of breath. Chest pain. Fast or irregular heartbeats (palpitations). Light-headedness, dizziness, or fainting. Coughing up blood. You have signs or symptoms that your blood is  too thin. These may include: Blood in your vomit, stool, or urine. A cut that will not stop bleeding. A menstrual period that is heavier than usual. A severe headache or confusion. These symptoms may be an emergency. Get help right away. Call 911. Do not wait to see if the symptoms will go away. Do not drive yourself to the hospital. Summary Deep vein thrombosis (DVT) happens when a blood clot forms in a deep vein. This may occur in the lower leg, thigh, pelvis, arm, or neck. Symptoms affect the arm or leg and can include swelling, pain, tenderness, warmth, redness, or discoloration. This condition may be treated with medicines. In severe cases, a procedure or surgery may be done to remove or dissolve the clots. If you are taking blood thinners, take them exactly as told. Do not skip a dose. Do not take more than is prescribed. Get help right away if you have a severe headache, shortness of breath, chest pain, fast or irregular heartbeats, or blood in your vomit, urine, or stool. This information is not intended to replace advice given to you by your health care provider. Make sure you discuss any questions you have with your health care provider. Document Revised: 08/21/2020 Document Reviewed: 08/21/2020 Elsevier Patient Education  2024 Elsevier Inc.  

## 2023-02-08 ENCOUNTER — Encounter (INDEPENDENT_AMBULATORY_CARE_PROVIDER_SITE_OTHER): Payer: Self-pay | Admitting: Vascular Surgery

## 2023-02-14 ENCOUNTER — Other Ambulatory Visit (INDEPENDENT_AMBULATORY_CARE_PROVIDER_SITE_OTHER): Payer: Self-pay

## 2023-02-14 MED ORDER — APIXABAN 5 MG PO TABS: 5.0000 mg | ORAL_TABLET | Freq: Two times a day (BID) | ORAL | 2 refills | Status: AC

## 2023-02-17 ENCOUNTER — Encounter: Payer: Self-pay | Admitting: Dermatology

## 2023-02-17 ENCOUNTER — Ambulatory Visit: Payer: BC Managed Care – PPO | Admitting: Dermatology

## 2023-02-17 DIAGNOSIS — W908XXA Exposure to other nonionizing radiation, initial encounter: Secondary | ICD-10-CM | POA: Diagnosis not present

## 2023-02-17 DIAGNOSIS — D1801 Hemangioma of skin and subcutaneous tissue: Secondary | ICD-10-CM

## 2023-02-17 DIAGNOSIS — L578 Other skin changes due to chronic exposure to nonionizing radiation: Secondary | ICD-10-CM

## 2023-02-17 DIAGNOSIS — D229 Melanocytic nevi, unspecified: Secondary | ICD-10-CM

## 2023-02-17 DIAGNOSIS — D2362 Other benign neoplasm of skin of left upper limb, including shoulder: Secondary | ICD-10-CM

## 2023-02-17 DIAGNOSIS — Z1283 Encounter for screening for malignant neoplasm of skin: Secondary | ICD-10-CM | POA: Diagnosis not present

## 2023-02-17 DIAGNOSIS — L814 Other melanin hyperpigmentation: Secondary | ICD-10-CM

## 2023-02-17 DIAGNOSIS — D2272 Melanocytic nevi of left lower limb, including hip: Secondary | ICD-10-CM

## 2023-02-17 DIAGNOSIS — L720 Epidermal cyst: Secondary | ICD-10-CM

## 2023-02-17 DIAGNOSIS — L821 Other seborrheic keratosis: Secondary | ICD-10-CM

## 2023-02-17 DIAGNOSIS — L738 Other specified follicular disorders: Secondary | ICD-10-CM

## 2023-02-17 DIAGNOSIS — D239 Other benign neoplasm of skin, unspecified: Secondary | ICD-10-CM

## 2023-02-17 DIAGNOSIS — Z86018 Personal history of other benign neoplasm: Secondary | ICD-10-CM

## 2023-02-17 DIAGNOSIS — L729 Follicular cyst of the skin and subcutaneous tissue, unspecified: Secondary | ICD-10-CM

## 2023-02-17 NOTE — Progress Notes (Signed)
 Follow-Up Visit   Subjective  Stacey Oconnell is a 63 y.o. adult who presents for the following: Skin Cancer Screening and Full Body Skin Exam  The patient presents for Total-Body Skin Exam (TBSE) for skin cancer screening and mole check. The patient has spots, moles and lesions to be evaluated, some may be new or changing and the patient may have concern these could be cancer.    The following portions of the chart were reviewed this encounter and updated as appropriate: medications, allergies, medical history  Review of Systems:  No other skin or systemic complaints except as noted in HPI or Assessment and Plan.  Objective  Well appearing patient in no apparent distress; mood and affect are within normal limits.  A full examination was performed including scalp, head, eyes, ears, nose, lips, neck, chest, axillae, abdomen, back, buttocks, bilateral upper extremities, bilateral lower extremities, hands, feet, fingers, toes, fingernails, and toenails. All findings within normal limits unless otherwise noted below.   Relevant physical exam findings are noted in the Assessment and Plan.               Assessment & Plan   SKIN CANCER SCREENING PERFORMED TODAY.  ACTINIC DAMAGE - Chronic condition, secondary to cumulative UV/sun exposure - diffuse scaly erythematous macules with underlying dyspigmentation - Recommend daily broad spectrum sunscreen SPF 30+ to sun-exposed areas, reapply every 2 hours as needed.  - Staying in the shade or wearing long sleeves, sun glasses (UVA+UVB protection) and wide brim hats (4-inch brim around the entire circumference of the hat) are also recommended for sun protection.  - Call for new or changing lesions.  LENTIGINES, SEBORRHEIC KERATOSES, HEMANGIOMAS - Benign normal skin lesions - Benign-appearing - Call for any changes  MELANOCYTIC NEVI - Tan-brown and/or pink-flesh-colored symmetric macules and papules - L 4th toe webspace 4.23mm  med light brown macule stable - L great toe webspace 2.67mm brown macule - back small scattered regular dark brown macules - Benign appearing on exam today - Observation - Call clinic for new or changing moles - Recommend daily use of broad spectrum spf 30+ sunscreen to sun-exposed areas.   DERMATOFIBROMA L post shoulder Exam: Firm pink/brown papulenodule with dimple sign. Treatment Plan: A dermatofibroma is a benign growth possibly related to trauma, such as an insect bite, cut from shaving, or inflamed acne-type bump.  Treatment options to remove include shave or excision with resulting scar and risk of recurrence.  Since benign-appearing and not bothersome, will observe for now.    HISTORY OF DYSPLASTIC NEVUS No evidence of recurrence today- Spinal upper back, multiple sites Recommend regular full body skin exams Recommend daily broad spectrum sunscreen SPF 30+ to sun-exposed areas, reapply every 2 hours as needed.  Call if any new or changing lesions are noted between office visits    Sebaceous Hyperplasia R zygoma, L nasal root - Small yellow papules with a central dell - Benign-appearing - Observe. Call for changes.   EPIDERMAL INCLUSION CYST Exam: Subcutaneous nodule at L lower post neck 1.0cm  Benign-appearing. Exam most consistent with an epidermal inclusion cyst. Discussed that a cyst is a benign growth that can grow over time and sometimes get irritated or inflamed. Recommend observation if it is not bothersome. Discussed option of surgical excision to remove it if it is growing, symptomatic, or other changes noted. Please call for new or changing lesions so they can be evaluated.     Return in about 1 year (around 02/17/2024) for TBSE,  Hx of Dysplastic nevi.  I, Grayce Saunas, RMA, am acting as scribe for Rexene Rattler, MD .   Documentation: I have reviewed the above documentation for accuracy and completeness, and I agree with the above.  Rexene Rattler, MD

## 2023-02-17 NOTE — Patient Instructions (Signed)

## 2023-03-11 ENCOUNTER — Other Ambulatory Visit: Payer: Self-pay | Admitting: Infectious Diseases

## 2023-03-11 DIAGNOSIS — Z1231 Encounter for screening mammogram for malignant neoplasm of breast: Secondary | ICD-10-CM

## 2023-03-21 ENCOUNTER — Ambulatory Visit (INDEPENDENT_AMBULATORY_CARE_PROVIDER_SITE_OTHER): Payer: BC Managed Care – PPO | Admitting: Vascular Surgery

## 2023-03-21 ENCOUNTER — Ambulatory Visit (INDEPENDENT_AMBULATORY_CARE_PROVIDER_SITE_OTHER): Payer: BC Managed Care – PPO

## 2023-03-21 ENCOUNTER — Encounter (INDEPENDENT_AMBULATORY_CARE_PROVIDER_SITE_OTHER): Payer: Self-pay | Admitting: Vascular Surgery

## 2023-03-21 VITALS — BP 134/85 | HR 97 | Resp 18 | Ht 73.0 in | Wt 174.4 lb

## 2023-03-21 DIAGNOSIS — I82451 Acute embolism and thrombosis of right peroneal vein: Secondary | ICD-10-CM | POA: Diagnosis not present

## 2023-03-21 NOTE — Progress Notes (Signed)
 MRN : [id number]  Stacey Oconnell is a 63 y.o. (18-Sep-1960) adult who presents with chief complaint of  Chief Complaint  Patient presents with   Follow-up    F/u 1 months  DVT  .  History of Present Illness: Patient returns today in follow up of a tibial vein DVT.  The patient has had 6 weeks of anticoagulation and seems to be doing well.  No significant leg pain or swelling at this time.  Biggest complaint is currently of some neck muscle issues.  No chest pain or shortness of breath.  Venous study today shows resolution of the previous tibial vein DVT with no evidence of recurrent deep venous thrombosis or superficial thrombophlebitis.  Current Outpatient Medications  Medication Sig Dispense Refill   apixaban  (ELIQUIS ) 5 MG TABS tablet Take 1 tablet (5 mg total) by mouth 2 (two) times daily. 60 tablet 2   APIXABAN  (ELIQUIS ) VTE STARTER PACK (10MG  AND 5MG ) Take as directed on package: start with two-5mg  tablets twice daily for 7 days. On day 8, switch to one-5mg  tablet twice daily. 74 each 0   Cholecalciferol (VITAMIN D3) 100000 UNIT/GM POWD Take by mouth.     estradiol  (ESTRACE ) 2 MG tablet Take 1 tablet (2 mg total) by mouth 3 (three) times daily. 270 tablet 3   fluticasone (FLONASE) 50 MCG/ACT nasal spray Place into both nostrils.     spironolactone  (ALDACTONE ) 100 MG tablet Take 1 tablet (100 mg total) by mouth 2 (two) times daily. 180 tablet 3   terbinafine (LAMISIL) 250 MG tablet Take 250 mg by mouth daily.     VITAMIN D PO Take 2,000 Units by mouth daily.     No current facility-administered medications for this visit.    Past Medical History:  Diagnosis Date   Allergy    seasonal   Dysplastic nevus 05/16/2015   Spinal upper back. Severe atypia, close to margin. Excised 06/05/2015, margins free.   Elevated prostate specific antigen (PSA) 2014   Pt report oncologist at [facility] suspected irritation from avid bike riding, nml since   Enlarged prostate    Hx of dysplastic  nevus 2007   multiple sites   Hyperlipidemia    managed with Mediterranean diet and exercise, +FHx in father   Syncope     Past Surgical History:  Procedure Laterality Date   COLONOSCOPY  05/2012   Dr.Oh at [facility]/normal exam    COLONOSCOPY W/ POLYPECTOMY  03/13/2018   polyps   COLONOSCOPY WITH PROPOFOL  04/25/2021     Social History   Tobacco Use   Smoking status: Never   Smokeless tobacco: Never  Vaping Use   Vaping status: Never Used  Substance Use Topics   Alcohol use: Not Currently    Alcohol/week: 1.0 standard drink of alcohol    Types: 1 Standard drinks or equivalent per week    Comment: Social   Drug use: Never      Family History  Problem Relation Age of Onset   Diabetes Mother    Hyperlipidemia Father    Hypertension Father    Colon polyps Father    Breast cancer Paternal Aunt    Colon cancer Neg Hx    Esophageal cancer Neg Hx    Rectal cancer Neg Hx    Stomach cancer Neg Hx      No Known Allergies   REVIEW OF SYSTEMS (Negative unless checked)   Constitutional: [] Weight loss  [] Fever  [] Chills Cardiac: [] Chest pain   [] Chest  pressure   [] Palpitations   [] Shortness of breath when laying flat   [] Shortness of breath at rest   [] Shortness of breath with exertion. Vascular:  [] Pain in legs with walking   [] Pain in legs at rest   [] Pain in legs when laying flat   [] Claudication   [] Pain in feet when walking  [] Pain in feet at rest  [] Pain in feet when laying flat   [x] History of DVT   [x] Phlebitis   [x] Swelling in legs   [] Varicose veins   [] Non-healing ulcers Pulmonary:   [] Uses home oxygen   [] Productive cough   [] Hemoptysis   [] Wheeze  [] COPD   [] Asthma Neurologic:  [] Dizziness  [] Blackouts   [] Seizures   [] History of stroke   [] History of TIA  [] Aphasia   [] Temporary blindness   [] Dysphagia   [] Weakness or numbness in arms   [] Weakness or numbness in legs Musculoskeletal:  [] Arthritis   [] Joint swelling   [] Joint pain   [] Low back pain Hematologic:   [] Easy bruising  [] Easy bleeding   [] Hypercoagulable state   [] Anemic  [] Hepatitis Gastrointestinal:  [] Blood in stool   [] Vomiting blood  [] Gastroesophageal reflux/heartburn   [] Abdominal pain Genitourinary:  [] Chronic kidney disease   [] Difficult urination  [] Frequent urination  [] Burning with urination   [] Hematuria Skin:  [] Rashes   [] Ulcers   [] Wounds Psychological:  [] History of anxiety   []  History of major depression.  Physical Examination  BP 134/85   Pulse 97   Resp 18   Ht 6' 1 (1.854 m)   Wt 174 lb 6.4 oz (79.1 kg)   BMI 23.01 kg/m  Gen:  WD/WN, NAD Head: /AT, No temporalis wasting. Ear/Nose/Throat: Hearing grossly intact, nares w/o erythema or drainage Eyes: Conjunctiva clear. Sclera non-icteric Neck: Supple.  Trachea midline Pulmonary:  Good air movement, no use of accessory muscles.  Cardiac: RRR, no JVD Vascular:  Vessel Right Left  Radial Palpable Palpable           Musculoskeletal: M/S 5/5 throughout.  No deformity or atrophy. No edema. Neurologic: Sensation grossly intact in extremities.  Symmetrical.  Speech is fluent.  Psychiatric: Judgment intact, Mood & affect appropriate for pt's clinical situation. Dermatologic: No rashes or ulcers noted.  No cellulitis or open wounds.      Labs No results found for this or any previous visit (from the past 2160 hours).  Radiology No results found.  Assessment/Plan  DVT (deep venous thrombosis) (HCC) Venous study today shows resolution of the previous tibial vein DVT with no evidence of recurrent deep venous thrombosis or superficial thrombophlebitis.  The patient has completed an appropriate 6-week course of anticoagulation for tibial vein DVT.  Would recommend 81 mg of aspirin daily going forward.  No further vascular workup or follow-up planned at this point.    Selinda Gu, MD  03/21/2023 1:55 PM    This note was created with Dragon medical transcription system.  Any errors from dictation are purely  unintentional

## 2023-03-21 NOTE — Assessment & Plan Note (Signed)
 Venous study today shows resolution of the previous tibial vein DVT with no evidence of recurrent deep venous thrombosis or superficial thrombophlebitis.  The patient has completed an appropriate 6-week course of anticoagulation for tibial vein DVT.  Would recommend 81 mg of aspirin daily going forward.  No further vascular workup or follow-up planned at this point.

## 2023-03-25 ENCOUNTER — Encounter (INDEPENDENT_AMBULATORY_CARE_PROVIDER_SITE_OTHER): Payer: Self-pay | Admitting: Vascular Surgery

## 2023-03-25 NOTE — Telephone Encounter (Signed)
Please see

## 2023-04-14 ENCOUNTER — Ambulatory Visit
Admission: RE | Admit: 2023-04-14 | Discharge: 2023-04-14 | Disposition: A | Discharge status: Home / Self Care | Payer: BC Managed Care – PPO | Source: Ambulatory Visit | Attending: Infectious Diseases | Admitting: Infectious Diseases

## 2023-04-14 DIAGNOSIS — Z1231 Encounter for screening mammogram for malignant neoplasm of breast: Secondary | ICD-10-CM

## 2023-07-01 ENCOUNTER — Encounter (INDEPENDENT_AMBULATORY_CARE_PROVIDER_SITE_OTHER): Payer: Self-pay

## 2024-02-23 ENCOUNTER — Ambulatory Visit: Payer: BC Managed Care – PPO | Admitting: Dermatology

## 2024-02-23 DIAGNOSIS — L720 Epidermal cyst: Secondary | ICD-10-CM | POA: Diagnosis not present

## 2024-02-23 DIAGNOSIS — L814 Other melanin hyperpigmentation: Secondary | ICD-10-CM

## 2024-02-23 DIAGNOSIS — L821 Other seborrheic keratosis: Secondary | ICD-10-CM

## 2024-02-23 DIAGNOSIS — L738 Other specified follicular disorders: Secondary | ICD-10-CM

## 2024-02-23 DIAGNOSIS — W908XXA Exposure to other nonionizing radiation, initial encounter: Secondary | ICD-10-CM

## 2024-02-23 DIAGNOSIS — Z86018 Personal history of other benign neoplasm: Secondary | ICD-10-CM | POA: Diagnosis not present

## 2024-02-23 DIAGNOSIS — D2272 Melanocytic nevi of left lower limb, including hip: Secondary | ICD-10-CM | POA: Diagnosis not present

## 2024-02-23 DIAGNOSIS — Z1283 Encounter for screening for malignant neoplasm of skin: Secondary | ICD-10-CM

## 2024-02-23 DIAGNOSIS — L729 Follicular cyst of the skin and subcutaneous tissue, unspecified: Secondary | ICD-10-CM

## 2024-02-23 DIAGNOSIS — D239 Other benign neoplasm of skin, unspecified: Secondary | ICD-10-CM

## 2024-02-23 DIAGNOSIS — L578 Other skin changes due to chronic exposure to nonionizing radiation: Secondary | ICD-10-CM | POA: Diagnosis not present

## 2024-02-23 DIAGNOSIS — D229 Melanocytic nevi, unspecified: Secondary | ICD-10-CM

## 2024-02-23 DIAGNOSIS — D2361 Other benign neoplasm of skin of right upper limb, including shoulder: Secondary | ICD-10-CM

## 2024-02-23 DIAGNOSIS — D2362 Other benign neoplasm of skin of left upper limb, including shoulder: Secondary | ICD-10-CM | POA: Diagnosis not present

## 2024-02-23 DIAGNOSIS — D1801 Hemangioma of skin and subcutaneous tissue: Secondary | ICD-10-CM

## 2024-02-23 DIAGNOSIS — D225 Melanocytic nevi of trunk: Secondary | ICD-10-CM

## 2024-02-23 NOTE — Progress Notes (Signed)
 "  Follow-Up Visit   Subjective  Stacey Oconnell is a 64 y.o. adult who presents for the following: Skin Cancer Screening and Full Body Skin Exam Hx of dysplastic nevus  Hx of epidermal cyst  Hx of isks  The patient presents for Total-Body Skin Exam (TBSE) for skin cancer screening and mole check. The patient has spots, moles and lesions to be evaluated, some may be new or changing and the patient may have concern these could be cancer.  The following portions of the chart were reviewed this encounter and updated as appropriate: medications, allergies, medical history  Review of Systems:  No other skin or systemic complaints except as noted in HPI or Assessment and Plan.  Objective  Well appearing patient in no apparent distress; mood and affect are within normal limits.  A full examination was performed including scalp, head, eyes, ears, nose, lips, neck, chest, axillae, abdomen, back, buttocks, bilateral upper extremities, bilateral lower extremities, hands, feet, fingers, toes, fingernails, and toenails. All findings within normal limits unless otherwise noted below.   Relevant physical exam findings are noted in the Assessment and Plan.    Assessment & Plan   SKIN CANCER SCREENING PERFORMED TODAY.  ACTINIC DAMAGE - Chronic condition, secondary to cumulative UV/sun exposure - diffuse scaly erythematous macules with underlying dyspigmentation - Recommend daily broad spectrum sunscreen SPF 30+ to sun-exposed areas, reapply every 2 hours as needed.  - Staying in the shade or wearing long sleeves, sun glasses (UVA+UVB protection) and wide brim hats (4-inch brim around the entire circumference of the hat) are also recommended for sun protection.  - Call for new or changing lesions.  LENTIGINES, SEBORRHEIC KERATOSES, HEMANGIOMAS - Benign normal skin lesions - Benign-appearing - Call for any changes  MELANOCYTIC NEVI - L 4th toe webspace 4.37mm med light brown macule stable - L  great toe webspace 2.89mm brown macule - back small scattered regular dark brown macules - Tan-brown and/or pink-flesh-colored symmetric macules and papules - Benign appearing on exam today, Stable. - Observation - Call clinic for new or changing moles - Recommend daily use of broad spectrum spf 30+ sunscreen to sun-exposed areas.   DERMATOFIBROMA L post shoulder and R post shoulder  Exam: Firm pink/brown papulenodule with dimple sign. Treatment Plan: A dermatofibroma is a benign growth possibly related to trauma, such as an insect bite, cut from shaving, or inflamed acne-type bump.  Treatment options to remove include shave or excision with resulting scar and risk of recurrence.  Since benign-appearing and not bothersome, will observe for now.   Sebaceous Hyperplasia R zygoma, L nasal root - Small yellow papules with a central dell - Benign-appearing - Observe. Call for changes.   EPIDERMAL INCLUSION CYST Exam: Subcutaneous nodule at L lower post neck 1.2cm   Benign-appearing. Exam most consistent with an epidermal inclusion cyst. Discussed that a cyst is a benign growth that can grow over time and sometimes get irritated or inflamed. Recommend observation if it is not bothersome. Discussed option of surgical excision to remove it if it is growing, symptomatic, or other changes noted. Please call for new or changing lesions so they can be evaluated.   HISTORY OF DYSPLASTIC NEVUS No evidence of recurrence today- 05/16/2015 Spinal upper back, multiple sites Recommend regular full body skin exams Recommend daily broad spectrum sunscreen SPF 30+ to sun-exposed areas, reapply every 2 hours as needed.  Call if any new or changing lesions are noted between office visits    Return in about  1 year (around 02/22/2025) for TBSE.  I, Eleanor Blush, CMA, am acting as scribe for Rexene Rattler, MD.   Documentation: I have reviewed the above documentation for accuracy and completeness, and I agree  with the above.  Rexene Rattler, MD    "

## 2024-02-23 NOTE — Patient Instructions (Signed)

## 2024-03-09 ENCOUNTER — Other Ambulatory Visit: Payer: Self-pay | Admitting: Infectious Diseases

## 2024-03-09 DIAGNOSIS — Z1231 Encounter for screening mammogram for malignant neoplasm of breast: Secondary | ICD-10-CM

## 2024-04-15 ENCOUNTER — Ambulatory Visit

## 2025-02-28 ENCOUNTER — Encounter: Admitting: Dermatology
